# Patient Record
Sex: Female | Born: 1935 | Race: White | Hispanic: No | Smoking: Former smoker
Health system: Southern US, Community
[De-identification: ages and names within clinical notes are randomized; demographics above are authoritative.]

## PROBLEM LIST (undated history)

## (undated) DIAGNOSIS — M1990 Unspecified osteoarthritis, unspecified site: Secondary | ICD-10-CM

## (undated) DIAGNOSIS — R079 Chest pain, unspecified: Secondary | ICD-10-CM

## (undated) DIAGNOSIS — I779 Disorder of arteries and arterioles, unspecified: Secondary | ICD-10-CM

## (undated) DIAGNOSIS — D510 Vitamin B12 deficiency anemia due to intrinsic factor deficiency: Secondary | ICD-10-CM

## (undated) DIAGNOSIS — Z8489 Family history of other specified conditions: Secondary | ICD-10-CM

## (undated) DIAGNOSIS — I10 Essential (primary) hypertension: Secondary | ICD-10-CM

## (undated) DIAGNOSIS — L409 Psoriasis, unspecified: Secondary | ICD-10-CM

## (undated) DIAGNOSIS — Z8781 Personal history of (healed) traumatic fracture: Secondary | ICD-10-CM

## (undated) DIAGNOSIS — R229 Localized swelling, mass and lump, unspecified: Secondary | ICD-10-CM

## (undated) DIAGNOSIS — I739 Peripheral vascular disease, unspecified: Secondary | ICD-10-CM

## (undated) DIAGNOSIS — Z8673 Personal history of transient ischemic attack (TIA), and cerebral infarction without residual deficits: Secondary | ICD-10-CM

## (undated) DIAGNOSIS — E785 Hyperlipidemia, unspecified: Secondary | ICD-10-CM

## (undated) DIAGNOSIS — G5601 Carpal tunnel syndrome, right upper limb: Secondary | ICD-10-CM

## (undated) DIAGNOSIS — J302 Other seasonal allergic rhinitis: Secondary | ICD-10-CM

## (undated) DIAGNOSIS — Z972 Presence of dental prosthetic device (complete) (partial): Secondary | ICD-10-CM

## (undated) HISTORY — PX: APPENDECTOMY: SHX54

## (undated) HISTORY — DX: Hyperlipidemia, unspecified: E78.5

## (undated) HISTORY — PX: CHOLECYSTECTOMY: SHX55

## (undated) HISTORY — DX: Chest pain, unspecified: R07.9

## (undated) HISTORY — DX: Psoriasis, unspecified: L40.9

## (undated) HISTORY — DX: Essential (primary) hypertension: I10

## (undated) HISTORY — DX: Disorder of arteries and arterioles, unspecified: I77.9

## (undated) HISTORY — DX: Vitamin B12 deficiency anemia due to intrinsic factor deficiency: D51.0

## (undated) HISTORY — DX: Peripheral vascular disease, unspecified: I73.9

---

## 1967-03-06 HISTORY — PX: TUBAL LIGATION: SHX77

## 1972-03-05 HISTORY — PX: ABDOMINAL HYSTERECTOMY: SHX81

## 1998-03-05 HISTORY — PX: CAROTID ENDARTERECTOMY: SUR193

## 2000-07-30 ENCOUNTER — Encounter (INDEPENDENT_AMBULATORY_CARE_PROVIDER_SITE_OTHER): Payer: Self-pay | Admitting: Specialist

## 2000-07-30 ENCOUNTER — Ambulatory Visit (HOSPITAL_BASED_OUTPATIENT_CLINIC_OR_DEPARTMENT_OTHER): Admission: RE | Admit: 2000-07-30 | Discharge: 2000-07-30 | Payer: Self-pay | Admitting: Surgery

## 2000-07-30 HISTORY — PX: SUBMANDIBULAR GLAND EXCISION: SHX2456

## 2003-01-21 ENCOUNTER — Encounter: Admission: RE | Admit: 2003-01-21 | Discharge: 2003-01-21 | Payer: Self-pay | Admitting: Internal Medicine

## 2004-01-11 ENCOUNTER — Ambulatory Visit: Payer: Self-pay | Admitting: Internal Medicine

## 2004-03-20 ENCOUNTER — Ambulatory Visit: Payer: Self-pay | Admitting: Internal Medicine

## 2004-04-03 ENCOUNTER — Ambulatory Visit: Payer: Self-pay | Admitting: Internal Medicine

## 2004-04-28 ENCOUNTER — Ambulatory Visit: Payer: Self-pay | Admitting: Family Medicine

## 2004-05-10 ENCOUNTER — Ambulatory Visit: Payer: Self-pay | Admitting: Internal Medicine

## 2004-05-15 ENCOUNTER — Ambulatory Visit: Payer: Self-pay | Admitting: Internal Medicine

## 2004-05-19 ENCOUNTER — Ambulatory Visit: Payer: Self-pay | Admitting: Cardiology

## 2004-05-30 ENCOUNTER — Ambulatory Visit: Payer: Self-pay

## 2004-06-05 ENCOUNTER — Ambulatory Visit: Payer: Self-pay | Admitting: Cardiology

## 2004-07-26 ENCOUNTER — Ambulatory Visit: Payer: Self-pay | Admitting: Internal Medicine

## 2004-07-28 ENCOUNTER — Ambulatory Visit: Payer: Self-pay | Admitting: Internal Medicine

## 2004-11-10 ENCOUNTER — Ambulatory Visit: Payer: Self-pay | Admitting: Internal Medicine

## 2005-01-05 ENCOUNTER — Ambulatory Visit: Payer: Self-pay | Admitting: Internal Medicine

## 2005-03-15 ENCOUNTER — Ambulatory Visit: Payer: Self-pay | Admitting: Internal Medicine

## 2005-03-21 ENCOUNTER — Ambulatory Visit: Payer: Self-pay | Admitting: Internal Medicine

## 2005-04-09 ENCOUNTER — Ambulatory Visit: Payer: Self-pay | Admitting: Gastroenterology

## 2005-04-10 ENCOUNTER — Encounter (INDEPENDENT_AMBULATORY_CARE_PROVIDER_SITE_OTHER): Payer: Self-pay | Admitting: Specialist

## 2005-04-10 ENCOUNTER — Ambulatory Visit: Payer: Self-pay | Admitting: Gastroenterology

## 2005-04-23 ENCOUNTER — Ambulatory Visit: Payer: Self-pay | Admitting: Gastroenterology

## 2005-04-30 ENCOUNTER — Ambulatory Visit (HOSPITAL_COMMUNITY): Admission: RE | Admit: 2005-04-30 | Discharge: 2005-04-30 | Payer: Self-pay | Admitting: Gastroenterology

## 2005-09-10 ENCOUNTER — Ambulatory Visit: Payer: Self-pay | Admitting: Internal Medicine

## 2005-10-29 ENCOUNTER — Ambulatory Visit: Payer: Self-pay | Admitting: Internal Medicine

## 2006-02-05 ENCOUNTER — Ambulatory Visit: Payer: Self-pay | Admitting: Internal Medicine

## 2006-02-19 ENCOUNTER — Ambulatory Visit: Payer: Self-pay | Admitting: Internal Medicine

## 2006-02-19 LAB — CONVERTED CEMR LAB
ALT: 15 units/L (ref 0–40)
AST: 21 units/L (ref 0–37)
BUN: 25 mg/dL — ABNORMAL HIGH (ref 6–23)
CO2: 27 meq/L (ref 19–32)
Calcium: 9.9 mg/dL (ref 8.4–10.5)
Chloride: 100 meq/L (ref 96–112)
Creatinine, Ser: 1.4 mg/dL — ABNORMAL HIGH (ref 0.4–1.2)
GFR calc non Af Amer: 40 mL/min
Glomerular Filtration Rate, Af Am: 48 mL/min/{1.73_m2}
Glucose, Bld: 87 mg/dL (ref 70–99)
HCT: 41.6 % (ref 36.0–46.0)
Hemoglobin: 13.5 g/dL (ref 12.0–15.0)
MCHC: 32.5 g/dL (ref 30.0–36.0)
MCV: 90.3 fL (ref 78.0–100.0)
Platelets: 183 10*3/uL (ref 150–400)
Potassium: 4.2 meq/L (ref 3.5–5.1)
RBC: 4.61 M/uL (ref 3.87–5.11)
RDW: 13.6 % (ref 11.5–14.6)
Sodium: 138 meq/L (ref 135–145)
TSH: 2.56 microintl units/mL (ref 0.35–5.50)
WBC: 7 10*3/uL (ref 4.5–10.5)

## 2006-02-22 ENCOUNTER — Encounter: Payer: Self-pay | Admitting: Internal Medicine

## 2006-02-22 ENCOUNTER — Other Ambulatory Visit: Admission: RE | Admit: 2006-02-22 | Discharge: 2006-02-22 | Payer: Self-pay | Admitting: Internal Medicine

## 2006-02-22 ENCOUNTER — Ambulatory Visit: Payer: Self-pay | Admitting: Internal Medicine

## 2006-03-11 ENCOUNTER — Ambulatory Visit: Payer: Self-pay | Admitting: Internal Medicine

## 2006-05-30 ENCOUNTER — Ambulatory Visit: Payer: Self-pay

## 2006-06-06 ENCOUNTER — Ambulatory Visit: Payer: Self-pay | Admitting: Cardiology

## 2006-07-31 DIAGNOSIS — Z8679 Personal history of other diseases of the circulatory system: Secondary | ICD-10-CM | POA: Insufficient documentation

## 2006-07-31 DIAGNOSIS — D510 Vitamin B12 deficiency anemia due to intrinsic factor deficiency: Secondary | ICD-10-CM | POA: Insufficient documentation

## 2006-07-31 DIAGNOSIS — J309 Allergic rhinitis, unspecified: Secondary | ICD-10-CM | POA: Insufficient documentation

## 2006-07-31 DIAGNOSIS — E785 Hyperlipidemia, unspecified: Secondary | ICD-10-CM | POA: Insufficient documentation

## 2006-07-31 DIAGNOSIS — I10 Essential (primary) hypertension: Secondary | ICD-10-CM | POA: Insufficient documentation

## 2006-08-06 ENCOUNTER — Telehealth (INDEPENDENT_AMBULATORY_CARE_PROVIDER_SITE_OTHER): Payer: Self-pay | Admitting: *Deleted

## 2006-08-09 ENCOUNTER — Encounter: Payer: Self-pay | Admitting: Internal Medicine

## 2006-08-23 ENCOUNTER — Encounter (INDEPENDENT_AMBULATORY_CARE_PROVIDER_SITE_OTHER): Payer: Self-pay | Admitting: *Deleted

## 2006-09-16 ENCOUNTER — Ambulatory Visit: Payer: Self-pay | Admitting: Internal Medicine

## 2006-09-16 DIAGNOSIS — Z9889 Other specified postprocedural states: Secondary | ICD-10-CM | POA: Insufficient documentation

## 2006-09-16 DIAGNOSIS — Z8719 Personal history of other diseases of the digestive system: Secondary | ICD-10-CM | POA: Insufficient documentation

## 2006-09-16 DIAGNOSIS — N189 Chronic kidney disease, unspecified: Secondary | ICD-10-CM | POA: Insufficient documentation

## 2006-09-17 LAB — CONVERTED CEMR LAB
ALT: 18 units/L (ref 0–35)
AST: 25 units/L (ref 0–37)
BUN: 20 mg/dL (ref 6–23)
CO2: 31 meq/L (ref 19–32)
Calcium: 10 mg/dL (ref 8.4–10.5)
Chloride: 103 meq/L (ref 96–112)
Cholesterol: 162 mg/dL (ref 0–200)
Creatinine, Ser: 1.3 mg/dL — ABNORMAL HIGH (ref 0.4–1.2)
GFR calc Af Amer: 52 mL/min
GFR calc non Af Amer: 43 mL/min
Glucose, Bld: 84 mg/dL (ref 70–99)
HDL: 82.5 mg/dL (ref >39.0)
LDL Cholesterol: 64 mg/dL (ref 0–99)
Potassium: 3.9 meq/L (ref 3.5–5.1)
Sodium: 143 meq/L (ref 135–145)
Total CHOL/HDL Ratio: 2
Triglycerides: 78 mg/dL (ref 0–149)
VLDL: 16 mg/dL (ref 0–40)

## 2006-09-18 ENCOUNTER — Encounter (INDEPENDENT_AMBULATORY_CARE_PROVIDER_SITE_OTHER): Payer: Self-pay | Admitting: *Deleted

## 2006-09-20 ENCOUNTER — Ambulatory Visit: Payer: Self-pay | Admitting: Family Medicine

## 2006-11-21 ENCOUNTER — Encounter: Payer: Self-pay | Admitting: Internal Medicine

## 2007-01-06 ENCOUNTER — Telehealth (INDEPENDENT_AMBULATORY_CARE_PROVIDER_SITE_OTHER): Payer: Self-pay | Admitting: *Deleted

## 2007-01-08 ENCOUNTER — Emergency Department (HOSPITAL_COMMUNITY): Admission: EM | Admit: 2007-01-08 | Discharge: 2007-01-08 | Payer: Self-pay | Admitting: Emergency Medicine

## 2007-01-08 ENCOUNTER — Telehealth: Payer: Self-pay | Admitting: Internal Medicine

## 2007-01-10 ENCOUNTER — Ambulatory Visit: Payer: Self-pay | Admitting: Cardiology

## 2007-01-10 ENCOUNTER — Ambulatory Visit: Payer: Self-pay | Admitting: Internal Medicine

## 2007-01-10 DIAGNOSIS — R519 Headache, unspecified: Secondary | ICD-10-CM | POA: Insufficient documentation

## 2007-01-10 DIAGNOSIS — R51 Headache: Secondary | ICD-10-CM | POA: Insufficient documentation

## 2007-01-28 ENCOUNTER — Telehealth (INDEPENDENT_AMBULATORY_CARE_PROVIDER_SITE_OTHER): Payer: Self-pay | Admitting: *Deleted

## 2007-01-29 ENCOUNTER — Encounter: Payer: Self-pay | Admitting: Internal Medicine

## 2007-03-19 ENCOUNTER — Ambulatory Visit: Payer: Self-pay | Admitting: Internal Medicine

## 2007-03-24 ENCOUNTER — Telehealth (INDEPENDENT_AMBULATORY_CARE_PROVIDER_SITE_OTHER): Payer: Self-pay | Admitting: *Deleted

## 2007-03-24 LAB — CONVERTED CEMR LAB
BUN: 19 mg/dL (ref 6–23)
Basophils Absolute: 0 10*3/uL (ref 0.0–0.1)
Basophils Relative: 0.1 % (ref 0.0–1.0)
CO2: 29 meq/L (ref 19–32)
Calcium: 10 mg/dL (ref 8.4–10.5)
Chloride: 103 meq/L (ref 96–112)
Creatinine, Ser: 1.2 mg/dL (ref 0.4–1.2)
Eosinophils Absolute: 0.3 10*3/uL (ref 0.0–0.6)
Eosinophils Relative: 5.3 % — ABNORMAL HIGH (ref 0.0–5.0)
Folate: 15.6 ng/mL
GFR calc Af Amer: 57 mL/min
GFR calc non Af Amer: 47 mL/min
Glucose, Bld: 81 mg/dL (ref 70–99)
HCT: 39.9 % (ref 36.0–46.0)
Hemoglobin: 13.4 g/dL (ref 12.0–15.0)
Iron: 83 ug/dL (ref 42–145)
Lymphocytes Relative: 40.6 % (ref 12.0–46.0)
MCHC: 33.6 g/dL (ref 30.0–36.0)
MCV: 87.8 fL (ref 78.0–100.0)
Monocytes Absolute: 0.5 10*3/uL (ref 0.2–0.7)
Monocytes Relative: 9 % (ref 3.0–11.0)
Neutro Abs: 2.5 10*3/uL (ref 1.4–7.7)
Neutrophils Relative %: 45 % (ref 43.0–77.0)
Platelets: 161 10*3/uL (ref 150–400)
Potassium: 4 meq/L (ref 3.5–5.1)
RBC: 4.55 M/uL (ref 3.87–5.11)
RDW: 14.2 % (ref 11.5–14.6)
Sodium: 141 meq/L (ref 135–145)
Vitamin B-12: 381 pg/mL (ref 211–911)
WBC: 5.5 10*3/uL (ref 4.5–10.5)

## 2007-04-03 ENCOUNTER — Ambulatory Visit: Payer: Self-pay | Admitting: Internal Medicine

## 2007-04-07 ENCOUNTER — Encounter: Payer: Self-pay | Admitting: Internal Medicine

## 2007-04-14 ENCOUNTER — Ambulatory Visit: Payer: Self-pay | Admitting: Internal Medicine

## 2007-05-01 ENCOUNTER — Encounter: Payer: Self-pay | Admitting: Internal Medicine

## 2007-05-13 ENCOUNTER — Telehealth (INDEPENDENT_AMBULATORY_CARE_PROVIDER_SITE_OTHER): Payer: Self-pay | Admitting: *Deleted

## 2007-05-13 ENCOUNTER — Encounter: Payer: Self-pay | Admitting: Internal Medicine

## 2007-09-03 ENCOUNTER — Telehealth (INDEPENDENT_AMBULATORY_CARE_PROVIDER_SITE_OTHER): Payer: Self-pay | Admitting: *Deleted

## 2007-09-04 ENCOUNTER — Ambulatory Visit: Payer: Self-pay | Admitting: Internal Medicine

## 2007-09-08 ENCOUNTER — Telehealth (INDEPENDENT_AMBULATORY_CARE_PROVIDER_SITE_OTHER): Payer: Self-pay | Admitting: *Deleted

## 2007-09-22 ENCOUNTER — Ambulatory Visit: Payer: Self-pay | Admitting: Internal Medicine

## 2007-09-30 ENCOUNTER — Telehealth (INDEPENDENT_AMBULATORY_CARE_PROVIDER_SITE_OTHER): Payer: Self-pay | Admitting: *Deleted

## 2007-09-30 LAB — CONVERTED CEMR LAB
ALT: 16 units/L (ref 0–35)
AST: 20 units/L (ref 0–37)
BUN: 20 mg/dL (ref 6–23)
CO2: 29 meq/L (ref 19–32)
Calcium: 9.5 mg/dL (ref 8.4–10.5)
Chloride: 106 meq/L (ref 96–112)
Cholesterol: 245 mg/dL (ref 0–200)
Creatinine, Ser: 1.1 mg/dL (ref 0.4–1.2)
Direct LDL: 162.1 mg/dL
GFR calc Af Amer: 63 mL/min
GFR calc non Af Amer: 52 mL/min
Glucose, Bld: 81 mg/dL (ref 70–99)
HDL: 72.8 mg/dL (ref >39.0)
Potassium: 4.7 meq/L (ref 3.5–5.1)
Sodium: 142 meq/L (ref 135–145)
Total CHOL/HDL Ratio: 3.4
Triglycerides: 84 mg/dL (ref 0–149)
VLDL: 17 mg/dL (ref 0–40)

## 2007-10-03 ENCOUNTER — Telehealth (INDEPENDENT_AMBULATORY_CARE_PROVIDER_SITE_OTHER): Payer: Self-pay | Admitting: *Deleted

## 2007-12-30 ENCOUNTER — Encounter (INDEPENDENT_AMBULATORY_CARE_PROVIDER_SITE_OTHER): Payer: Self-pay | Admitting: *Deleted

## 2007-12-30 ENCOUNTER — Telehealth: Payer: Self-pay | Admitting: Internal Medicine

## 2008-01-01 ENCOUNTER — Ambulatory Visit: Payer: Self-pay | Admitting: Internal Medicine

## 2008-01-01 LAB — CONVERTED CEMR LAB
ALT: 16 units/L (ref 0–35)
AST: 19 units/L (ref 0–37)
Cholesterol: 149 mg/dL (ref 0–200)
HDL: 68.9 mg/dL (ref >39.0)
LDL Cholesterol: 64 mg/dL (ref 0–99)
Total CHOL/HDL Ratio: 2.2
Triglycerides: 80 mg/dL (ref 0–149)
VLDL: 16 mg/dL (ref 0–40)

## 2008-01-05 ENCOUNTER — Telehealth (INDEPENDENT_AMBULATORY_CARE_PROVIDER_SITE_OTHER): Payer: Self-pay | Admitting: *Deleted

## 2008-01-09 ENCOUNTER — Telehealth (INDEPENDENT_AMBULATORY_CARE_PROVIDER_SITE_OTHER): Payer: Self-pay | Admitting: *Deleted

## 2008-01-16 ENCOUNTER — Ambulatory Visit: Payer: Self-pay | Admitting: Family Medicine

## 2008-01-20 ENCOUNTER — Encounter (INDEPENDENT_AMBULATORY_CARE_PROVIDER_SITE_OTHER): Payer: Self-pay | Admitting: *Deleted

## 2008-03-11 ENCOUNTER — Ambulatory Visit: Payer: Self-pay | Admitting: Internal Medicine

## 2008-03-11 LAB — CONVERTED CEMR LAB
Basophils Absolute: 0.1 10*3/uL (ref 0.0–0.1)
Basophils Relative: 0.9 % (ref 0.0–3.0)
Eosinophils Absolute: 0.2 10*3/uL (ref 0.0–0.7)
Eosinophils Relative: 2.6 % (ref 0.0–5.0)
HCT: 37.8 % (ref 36.0–46.0)
Hemoglobin: 12.5 g/dL (ref 12.0–15.0)
Lymphocytes Relative: 31.8 % (ref 12.0–46.0)
MCHC: 33.2 g/dL (ref 30.0–36.0)
MCV: 89.5 fL (ref 78.0–100.0)
Monocytes Absolute: 0.5 10*3/uL (ref 0.1–1.0)
Monocytes Relative: 8 % (ref 3.0–12.0)
Neutro Abs: 3.8 10*3/uL (ref 1.4–7.7)
Neutrophils Relative %: 56.7 % (ref 43.0–77.0)
Platelets: 161 10*3/uL (ref 150–400)
RBC: 4.22 M/uL (ref 3.87–5.11)
RDW: 14.4 % (ref 11.5–14.6)
WBC: 6.8 10*3/uL (ref 4.5–10.5)

## 2008-03-12 ENCOUNTER — Encounter: Payer: Self-pay | Admitting: Internal Medicine

## 2008-03-12 LAB — CONVERTED CEMR LAB: IgE (Immunoglobulin E), Serum: 3.6 intl units/mL (ref 0.0–180.0)

## 2008-03-21 DIAGNOSIS — J329 Chronic sinusitis, unspecified: Secondary | ICD-10-CM | POA: Insufficient documentation

## 2008-04-20 ENCOUNTER — Ambulatory Visit: Payer: Self-pay | Admitting: Internal Medicine

## 2008-04-22 ENCOUNTER — Encounter: Admission: RE | Admit: 2008-04-22 | Discharge: 2008-04-22 | Payer: Self-pay | Admitting: Otolaryngology

## 2008-05-15 ENCOUNTER — Emergency Department (HOSPITAL_BASED_OUTPATIENT_CLINIC_OR_DEPARTMENT_OTHER): Admission: EM | Admit: 2008-05-15 | Discharge: 2008-05-16 | Payer: Self-pay | Admitting: Emergency Medicine

## 2008-06-17 ENCOUNTER — Ambulatory Visit: Payer: Self-pay | Admitting: Internal Medicine

## 2008-06-17 LAB — CONVERTED CEMR LAB: Vit D, 25-Hydroxy: 53 ng/mL (ref 30–89)

## 2008-06-23 LAB — CONVERTED CEMR LAB
Folate: 13.3 ng/mL
TSH: 3.36 microintl units/mL (ref 0.35–5.50)
Vitamin B-12: 565 pg/mL (ref 211–911)

## 2008-06-25 ENCOUNTER — Encounter: Payer: Self-pay | Admitting: Internal Medicine

## 2008-07-06 ENCOUNTER — Encounter (INDEPENDENT_AMBULATORY_CARE_PROVIDER_SITE_OTHER): Payer: Self-pay | Admitting: *Deleted

## 2008-07-12 ENCOUNTER — Telehealth: Payer: Self-pay | Admitting: Internal Medicine

## 2008-07-16 ENCOUNTER — Telehealth (INDEPENDENT_AMBULATORY_CARE_PROVIDER_SITE_OTHER): Payer: Self-pay | Admitting: *Deleted

## 2008-08-26 DIAGNOSIS — I6529 Occlusion and stenosis of unspecified carotid artery: Secondary | ICD-10-CM | POA: Insufficient documentation

## 2008-09-03 ENCOUNTER — Ambulatory Visit: Payer: Self-pay | Admitting: Cardiology

## 2008-09-03 DIAGNOSIS — Z87448 Personal history of other diseases of urinary system: Secondary | ICD-10-CM | POA: Insufficient documentation

## 2008-09-09 ENCOUNTER — Ambulatory Visit: Payer: Self-pay

## 2008-09-09 ENCOUNTER — Encounter: Payer: Self-pay | Admitting: Cardiology

## 2008-09-15 ENCOUNTER — Telehealth: Payer: Self-pay | Admitting: Cardiology

## 2008-09-16 ENCOUNTER — Telehealth: Payer: Self-pay | Admitting: Cardiology

## 2008-10-15 ENCOUNTER — Encounter (INDEPENDENT_AMBULATORY_CARE_PROVIDER_SITE_OTHER): Payer: Self-pay | Admitting: *Deleted

## 2008-10-28 ENCOUNTER — Ambulatory Visit: Payer: Self-pay | Admitting: Internal Medicine

## 2008-11-11 ENCOUNTER — Encounter: Payer: Self-pay | Admitting: Internal Medicine

## 2008-11-18 ENCOUNTER — Ambulatory Visit: Payer: Self-pay | Admitting: Internal Medicine

## 2008-11-18 DIAGNOSIS — F411 Generalized anxiety disorder: Secondary | ICD-10-CM | POA: Insufficient documentation

## 2008-11-23 ENCOUNTER — Encounter (INDEPENDENT_AMBULATORY_CARE_PROVIDER_SITE_OTHER): Payer: Self-pay | Admitting: *Deleted

## 2008-11-23 LAB — CONVERTED CEMR LAB
BUN: 22 mg/dL (ref 6–23)
CO2: 30 meq/L (ref 19–32)
Calcium: 8.9 mg/dL (ref 8.4–10.5)
Chloride: 108 meq/L (ref 96–112)
Cholesterol: 150 mg/dL (ref 0–200)
Creatinine, Ser: 1.3 mg/dL — ABNORMAL HIGH (ref 0.4–1.2)
GFR calc non Af Amer: 42.77 mL/min (ref >60)
Glucose, Bld: 93 mg/dL (ref 70–99)
HDL: 66.8 mg/dL (ref >39.00)
LDL Cholesterol: 67 mg/dL (ref 0–99)
Potassium: 4.9 meq/L (ref 3.5–5.1)
Sodium: 144 meq/L (ref 135–145)
Total CHOL/HDL Ratio: 2
Triglycerides: 79 mg/dL (ref 0.0–149.0)
VLDL: 15.8 mg/dL (ref 0.0–40.0)

## 2008-12-06 ENCOUNTER — Encounter: Payer: Self-pay | Admitting: Internal Medicine

## 2009-02-08 ENCOUNTER — Encounter: Payer: Self-pay | Admitting: Cardiology

## 2009-02-16 ENCOUNTER — Encounter: Payer: Self-pay | Admitting: Internal Medicine

## 2009-02-16 HISTORY — PX: ANTERIOR CERVICAL DECOMP/DISCECTOMY FUSION: SHX1161

## 2009-03-05 HISTORY — PX: SHOULDER ARTHROSCOPY WITH SUBACROMIAL DECOMPRESSION, ROTATOR CUFF REPAIR AND BICEP TENDON REPAIR: SHX5687

## 2009-04-01 ENCOUNTER — Encounter: Payer: Self-pay | Admitting: Internal Medicine

## 2009-04-04 ENCOUNTER — Telehealth: Payer: Self-pay | Admitting: Internal Medicine

## 2009-05-26 ENCOUNTER — Ambulatory Visit: Payer: Self-pay | Admitting: Internal Medicine

## 2009-05-26 DIAGNOSIS — M1990 Unspecified osteoarthritis, unspecified site: Secondary | ICD-10-CM | POA: Insufficient documentation

## 2009-05-26 DIAGNOSIS — M8580 Other specified disorders of bone density and structure, unspecified site: Secondary | ICD-10-CM | POA: Insufficient documentation

## 2009-05-27 ENCOUNTER — Ambulatory Visit: Payer: Self-pay | Admitting: Internal Medicine

## 2009-06-01 LAB — CONVERTED CEMR LAB
ALT: 19 units/L (ref 0–35)
AST: 29 units/L (ref 0–37)
BUN: 18 mg/dL (ref 6–23)
Basophils Absolute: 0.1 10*3/uL (ref 0.0–0.1)
Basophils Relative: 1 % (ref 0.0–3.0)
CO2: 27 meq/L (ref 19–32)
Calcium: 9.2 mg/dL (ref 8.4–10.5)
Chloride: 104 meq/L (ref 96–112)
Creatinine, Ser: 1.2 mg/dL (ref 0.4–1.2)
Eosinophils Absolute: 0.2 10*3/uL (ref 0.0–0.7)
Eosinophils Relative: 3.5 % (ref 0.0–5.0)
GFR calc non Af Amer: 46.84 mL/min (ref >60)
Glucose, Bld: 81 mg/dL (ref 70–99)
HCT: 40.8 % (ref 36.0–46.0)
Hemoglobin: 13.2 g/dL (ref 12.0–15.0)
Lymphocytes Relative: 35.2 % (ref 12.0–46.0)
Lymphs Abs: 1.9 10*3/uL (ref 0.7–4.0)
MCHC: 32.2 g/dL (ref 30.0–36.0)
MCV: 90.4 fL (ref 78.0–100.0)
Monocytes Absolute: 0.5 10*3/uL (ref 0.1–1.0)
Monocytes Relative: 9.8 % (ref 3.0–12.0)
Neutro Abs: 2.6 10*3/uL (ref 1.4–7.7)
Neutrophils Relative %: 50.5 % (ref 43.0–77.0)
Platelets: 142 10*3/uL — ABNORMAL LOW (ref 150.0–400.0)
Potassium: 3.9 meq/L (ref 3.5–5.1)
RBC: 4.51 M/uL (ref 3.87–5.11)
RDW: 13.8 % (ref 11.5–14.6)
Sodium: 142 meq/L (ref 135–145)
WBC: 5.3 10*3/uL (ref 4.5–10.5)

## 2009-09-16 ENCOUNTER — Telehealth: Payer: Self-pay | Admitting: Internal Medicine

## 2009-09-28 ENCOUNTER — Encounter: Payer: Self-pay | Admitting: Cardiology

## 2009-09-29 ENCOUNTER — Ambulatory Visit: Payer: Self-pay

## 2009-09-29 ENCOUNTER — Ambulatory Visit: Payer: Self-pay | Admitting: Cardiology

## 2009-10-09 ENCOUNTER — Encounter: Payer: Self-pay | Admitting: Internal Medicine

## 2009-10-10 ENCOUNTER — Encounter: Payer: Self-pay | Admitting: Internal Medicine

## 2009-10-17 ENCOUNTER — Ambulatory Visit: Payer: Self-pay | Admitting: Internal Medicine

## 2009-10-17 DIAGNOSIS — L409 Psoriasis, unspecified: Secondary | ICD-10-CM | POA: Insufficient documentation

## 2009-10-18 ENCOUNTER — Encounter: Payer: Self-pay | Admitting: Internal Medicine

## 2009-10-19 ENCOUNTER — Ambulatory Visit: Payer: Self-pay | Admitting: Internal Medicine

## 2009-10-21 LAB — CONVERTED CEMR LAB
BUN: 20 mg/dL (ref 6–23)
CO2: 30 meq/L (ref 19–32)
Calcium: 9.9 mg/dL (ref 8.4–10.5)
Chloride: 105 meq/L (ref 96–112)
Creatinine, Ser: 1.1 mg/dL (ref 0.4–1.2)
GFR calc non Af Amer: 50.14 mL/min (ref >60)
Glucose, Bld: 75 mg/dL (ref 70–99)
Potassium: 4.8 meq/L (ref 3.5–5.1)
Sodium: 143 meq/L (ref 135–145)

## 2009-11-03 ENCOUNTER — Encounter: Payer: Self-pay | Admitting: Internal Medicine

## 2009-11-03 ENCOUNTER — Telehealth: Payer: Self-pay | Admitting: Internal Medicine

## 2009-11-15 ENCOUNTER — Encounter: Payer: Self-pay | Admitting: Cardiovascular Disease

## 2009-11-15 ENCOUNTER — Telehealth: Payer: Self-pay | Admitting: Cardiology

## 2010-01-03 ENCOUNTER — Encounter: Payer: Self-pay | Admitting: Internal Medicine

## 2010-02-20 ENCOUNTER — Ambulatory Visit: Payer: Self-pay | Admitting: Internal Medicine

## 2010-02-20 ENCOUNTER — Other Ambulatory Visit
Admission: RE | Admit: 2010-02-20 | Discharge: 2010-02-20 | Payer: Self-pay | Source: Home / Self Care | Admitting: Internal Medicine

## 2010-02-20 ENCOUNTER — Encounter: Payer: Self-pay | Admitting: Internal Medicine

## 2010-02-20 LAB — CONVERTED CEMR LAB: Vit D, 25-Hydroxy: 64 ng/mL (ref 30–89)

## 2010-02-20 LAB — HM PAP SMEAR: HM Pap smear: NORMAL

## 2010-02-22 LAB — CONVERTED CEMR LAB
ALT: 17 units/L (ref 0–35)
AST: 25 units/L (ref 0–37)
BUN: 20 mg/dL (ref 6–23)
CO2: 30 meq/L (ref 19–32)
Calcium: 9.5 mg/dL (ref 8.4–10.5)
Chloride: 105 meq/L (ref 96–112)
Cholesterol: 162 mg/dL (ref 0–200)
Creatinine, Ser: 1.3 mg/dL — ABNORMAL HIGH (ref 0.4–1.2)
GFR calc non Af Amer: 41.15 mL/min — ABNORMAL LOW (ref >60.00)
Glucose, Bld: 91 mg/dL (ref 70–99)
HDL: 69.9 mg/dL (ref >39.00)
LDL Cholesterol: 77 mg/dL (ref 0–99)
Potassium: 4.9 meq/L (ref 3.5–5.1)
Sodium: 143 meq/L (ref 135–145)
Total CHOL/HDL Ratio: 2
Triglycerides: 77 mg/dL (ref 0.0–149.0)
VLDL: 15.4 mg/dL (ref 0.0–40.0)

## 2010-03-07 LAB — CONVERTED CEMR LAB: Pap Smear: NEGATIVE

## 2010-04-04 NOTE — Progress Notes (Signed)
Summary: Refill Request  Phone Note Refill Request Call back at [phone] Message from:  Pharmacy on September 16, 2009 2:40 PM  Refills Requested: Medication #1:  KLONOPIN 0.5 MG TABS 1 by mouth two times a day; 2 by mouth at bedtime as needed anxiety-insomnia.   Dosage confirmed as above?Dosage Confirmed   Supply Requested: 1 month [facility] [address]  Next Appointment Scheduled: August 15th 2011 Initial call taken by: Lavell Islam,  September 16, 2009 2:43 PM  Follow-up for Phone Call        ok 90 and 1 RF Ivar Domangue E. Lemon Whitacre MD  September 16, 2009 3:56 PM     Prescriptions: KLONOPIN 0.5 MG TABS (CLONAZEPAM) 1 by mouth two times a day; 2 by mouth at bedtime as needed anxiety-insomnia  #90 x 1   Entered by:   Army Fossa CMA   Authorized by:   Nolon Rod. Cashius Grandstaff MD   Signed by:   Army Fossa CMA on 09/16/2009   Method used:   Printed then faxed to ...       [facility] [phone]* (retail)       [address]       [place], [place]  [address]       Ph: [phone]       Fax: [phone]   RxID:   [id number]

## 2010-04-04 NOTE — Assessment & Plan Note (Signed)
Summary: 4 MONTH OV//PH   Vital Signs:  Patient profile:   75 year old female Height:      64.25 inches Weight:      153.4 pounds BMI:     26.22 Pulse rate:   64 / minute BP sitting:   132 / 70  Vitals Entered By: R.Peeler CC: f/u Comments fasting pain in right arm   History of Present Illness: 12-10 --s/p extensive cervical spine surgery, decompression, allograft placement-- note reviewed  neck pain better, still pain in the shoulders, saw Dr Ethelene Hal recently for a shot: did help  going back to work next week     Hypertension-- good medication compliance, ambulatory BPs 130s   h/o pernicious anemia-- on B12 shots, good medication compliance   osteopenia -- good medication compliance w/ fosamax, no problems taking it     Allergies: 1)  ! Morphine Sulfate (Morphine Sulfate) 2)  ! Flexeril (Cyclobenzaprine Hcl) 3)  ! * Clarithromycin 4)  ! Amoxicillin 5)  ! Tramadol Hcl 6)  ! Codeine 7)  ! Hydrocodone  Past History:  Past Medical History: Coronary artery disease, stress test 2004 "fix defect"    Cerebrovascular accident 2000,   CAROTID ENDARTERECTOMY, RIGHT,  -- due to trauma    Hyperlipidemia    Hypertension    Allergic rhinitis- Skin test 04/20/08    Recurrent rhinosinusitis    Several pneumonias and otitis  as a child.    Salivary gland infection, s/p removal Dr Ezzard Standing    Hemorrhoid    h/o pernicious anemia Hemorrhoid psoriasis (2010) osteopenia,  DEXA 4-10 , started Fosamax    Past Surgical History: Cholecystectomy tubal ligation Hysterectomy, no oophorectomy Salivary gland & LN resection 2002 ,Dr Ezzard Standing CAROTID ENDARTERECTOMY, RIGHT 2,000 -- due to trauma Appendectomy 12-10 extensive cervical spine surgery, decompression, allograft placement  Social History: Reviewed history from 04/20/2008 and no changes required. Former Smoker-1994 Alcohol use-no Single/ divorced/ lives alone 3 children part-time Statistician- Holiday representative- stands in  doorway.  Review of Systems       feels well except for some fatigue since the surgery CV:  Denies chest pain or discomfort and swelling of feet; no orthopnea. Resp:  Denies cough and wheezing. GI:  Denies diarrhea, nausea, and vomiting.  Physical Exam  General:  alert, well-developed, and well-nourished.   Neck:  range of motion apparently without deficits Lungs:  normal respiratory effort, no intercostal retractions, no accessory muscle use, and normal breath sounds.   Heart:  normal rate, regular rhythm, and no murmur.   Extremities:  no edema Psych:  Oriented X3, memory intact for recent and remote, normally interactive, good eye contact, not anxious appearing, and not depressed appearing.  good spirits   Impression & Recommendations:  Problem # 1:  slight fatigue, likely deconditioning, labs  Problem # 2:  HYPERTENSION (ICD-401.9) well-controlled, reports good ambulatory BPs, labs Her updated medication list for this problem includes:    Hydrochlorothiazide 25 Mg Tabs (Hydrochlorothiazide) .Marland Kitchen... 1 by mouth once daily    Diovan 160 Mg Tabs (Valsartan) .Marland Kitchen... 1 by mouth qd    Felodipine 5 Mg Tb24 (Felodipine) .Marland Kitchen... 1 by mouth once daily  BP today: 132/70 Prior BP: 124/80 (11/18/2008)  Labs Reviewed: K+: 4.9 (11/18/2008) Creat: : 1.3 (11/18/2008)   Chol: 150 (11/18/2008)   HDL: 66.80 (11/18/2008)   LDL: 67 (11/18/2008)   TG: 79.0 (11/18/2008)  Orders: Venipuncture (30865) TLB-BMP (Basic Metabolic Panel-BMET) (80048-METABOL) TLB-CBC Platelet - w/Differential (85025-CBCD)  Problem # 3:  HYPERLIPIDEMIA (  ICD-272.4) on medication, labs Her updated medication list for this problem includes:    Crestor 40 Mg Tabs (Rosuvastatin calcium) .Marland Kitchen... 1 by mouth qhs  Orders: TLB-ALT (SGPT) (84460-ALT) TLB-AST (SGOT) (84450-SGOT)  Labs Reviewed: SGOT: 19 (01/01/2008)   SGPT: 16 (01/01/2008)   HDL:66.80 (11/18/2008), 68.9 (01/01/2008)  LDL:67 (11/18/2008), 64 (01/01/2008)  Chol:150  (11/18/2008), 149 (01/01/2008)  Trig:79.0 (11/18/2008), 80 (01/01/2008)  Problem # 4:  OSTEOPENIA (ICD-733.90) bone density test 4-10 showed  osteopenia vitamin D at that time was normal tolerating Fosamax well Her updated medication list for this problem includes:    Alendronate Sodium 70 Mg Tabs (Alendronate sodium) .Marland Kitchen... 1 by mouth qwk  Problem # 5:  DEGENERATIVE JOINT DISEASE (ICD-715.90) extensive neck surgery 02/2009, doing well, still on physical therapy Her updated medication list for this problem includes:    Aspir-low 81 Mg Tbec (Aspirin) .Marland Kitchen... Daily  Complete Medication List: 1)  Hydrochlorothiazide 25 Mg Tabs (Hydrochlorothiazide) .Marland Kitchen.. 1 by mouth once daily 2)  Crestor 40 Mg Tabs (Rosuvastatin calcium) .Marland Kitchen.. 1 by mouth qhs 3)  Diovan 160 Mg Tabs (Valsartan) .Marland Kitchen.. 1 by mouth qd 4)  Flonase 50 Mcg/act Susp (Fluticasone propionate) .... 2 sprays each nostril once daily 5)  Felodipine 5 Mg Tb24 (Felodipine) .Marland Kitchen.. 1 by mouth once daily 6)  Cyanocobalamin 1000 Mcg/ml Inj Soln (Cyanocobalamin) .Marland Kitchen.. 1000 micrograms qmo 7)  Alendronate Sodium 70 Mg Tabs (Alendronate sodium) .Marland Kitchen.. 1 by mouth qwk 8)  Aspir-low 81 Mg Tbec (Aspirin) .... Daily 9)  Klonopin 0.5 Mg Tabs (Clonazepam) .Marland Kitchen.. 1 by mouth two times a day; 2 by mouth at bedtime as needed anxiety-insomnia  Patient Instructions: 1)  Please schedule a follow-up appointment in 4 months .

## 2010-04-04 NOTE — Letter (Signed)
Summary: [facility]-- sinusitis  [facility]   Imported By: Lanelle Bal 10/18/2009 10:27:51  _____________________________________________________________________  External Attachment:    Type:   Image     Comment:   External Document

## 2010-04-04 NOTE — Medication Information (Signed)
Summary: Med Orders  Med Orders   Imported By: Marylou Mccoy 11/30/2009 10:52:29  _____________________________________________________________________  External Attachment:    Type:   Image     Comment:   External Document

## 2010-04-04 NOTE — Progress Notes (Signed)
Summary: referral to PT  Phone Note Call from Patient Call back at Home Phone [phone]   Summary of Call: recently had surgery on her neck @ [facility], requesting referral to PT in [place] so she doesn't have to travel to [facility]  April 04, 2009 4:55 PM   Follow-up for Phone Call        yes, needs to instructions from her surgeon regards what she needs as far as PT [name] Moya E. Sahmya Arai MD  April 04, 2009 5:04 PM   Patient dropped off order from surgeon @ [facility] -- will forward to [facility] for referral.  Patient has rov scheduled for 3/11 [facility]  April 05, 2009 1:17 PM     Prescriptions: CRESTOR 40 MG  TABS (ROSUVASTATIN CALCIUM) 1 by mouth qhs  #30 Tablet x 3   Entered by:   Shary Decamp   Authorized by:   Nolon Rod. Suriya Kovarik MD   Signed by:   Shary Decamp on 04/05/2009   Method used:   Electronically to        [facility] [phone]* (retail)       [address]       [place], [place]  [address]       Ph: [phone]       Fax: [phone]   RxID:   [id number]

## 2010-04-04 NOTE — Consult Note (Signed)
Summary: shoulder  pain, refered to ortho, MRI---[facility]   Imported By: Lanelle Bal 10/27/2009 11:29:03  _____________________________________________________________________  External Attachment:    Type:   Image     Comment:   External Document

## 2010-04-04 NOTE — Progress Notes (Signed)
Summary: Need order to stop Aspirin due to surgery   Phone Note Other Incoming   Caller: [facility] [phone] Summary of Call: Need order faxed for pt to stop Aspirin due to surgery fax [phone] Initial call taken by: Judie Grieve,  November 15, 2009 10:13 AM  Follow-up for Phone Call        I spoke with [facility]. The pt is scheduled for right rotator cuff repair with open DCR. Dr. Ranell Patrick is her surgeon. Surgery date is 9/19. They request she hold ASA for 5 days prior to surgery. Per Zollie Scale, the pt has told her that for prior surgeries she has held ASA for only 3 days. Dr. Drue Novel has given the ok to hold ASA, but with the pt's history, they want cardiology to give the ok to hold ASA. I will review with the DOD. Sherri Rad, RN, BSN  November 15, 2009 10:32 AM   I reviewed the above with Dr. Clifton James- orders received that pt may hold ASA 5 days prior to surgery. I will fax a note. Follow-up by: Sherri Rad, RN, BSN,  November 15, 2009 10:54 AM

## 2010-04-04 NOTE — Op Note (Signed)
Summary: Cervical Spine surgery----[facility]  Cervical Spine/[facility]   Imported By: Lanelle Bal 04/12/2009 09:28:22  _____________________________________________________________________  External Attachment:    Type:   Image     Comment:   External Document

## 2010-04-04 NOTE — Assessment & Plan Note (Signed)
Summary: rto 4 months.cbs   Vital Signs:  Patient profile:   76 year old female Weight:      158.25 pounds Pulse rate:   87 / minute Pulse rhythm:   regular BP sitting:   128 / 82  (left arm) Cuff size:   large  Vitals Entered By: Army Fossa CMA (October 17, 2009 2:02 PM) CC: 4 month f/u: fasting  Comments Discuss Fosamax would like to know if you would refill her Clobetasol Propionate 0.05% (She does not want to see Dr.Lupton anymore)    History of Present Illness: routine office visit Feels well Note from cardiology reviewed, she is stable  ROS Continue with shoulder pain, to see Dr.Ramos tomorrow Has a rash on and off, she saw dermatology before, was prescribed clobetasol as needed. Would like [place] to refill her medicines reports a cyst in the right wrist Denies chest pain or shortness of breath No cough or  lower extremity edema  Current Medications (verified): 1)  Hydrochlorothiazide 25 Mg Tabs (Hydrochlorothiazide) .Marland Kitchen.. 1 By Mouth Once Daily 2)  Crestor 40 Mg  Tabs (Rosuvastatin Calcium) .Marland Kitchen.. 1 By Mouth Qhs 3)  Diovan 160 Mg Tabs (Valsartan) .Marland Kitchen.. 1 By Mouth Once Daily. Needs Office Visit. 4)  Flonase 50 Mcg/act  Susp (Fluticasone Propionate) .... 2 Sprays Each Nostril Once Daily 5)  Felodipine 5 Mg  Tb24 (Felodipine) .Marland Kitchen.. 1 By Mouth Once Daily. Needs Office Visit. 6)  Cyanocobalamin 1000 Mcg/ml Inj Soln (Cyanocobalamin) .Marland Kitchen.. 1000 Micrograms Qmo 7)  Alendronate Sodium 70 Mg Tabs (Alendronate Sodium) .Marland Kitchen.. 1 By Mouth Qwk 8)  Aspir-Low 81 Mg Tbec (Aspirin) .... Daily 9)  Klonopin 0.5 Mg Tabs (Clonazepam) .Marland Kitchen.. 1 By Mouth Two Times A Day; 2 By Mouth At Bedtime As Needed Anxiety-Insomnia 10)  Clobetasol Propionate 0.05 % Crea (Clobetasol Propionate) .... Apply A Small Amount To Affected Areas Two Times A Day  Allergies: 1)  ! Morphine Sulfate (Morphine Sulfate) 2)  ! Flexeril (Cyclobenzaprine Hcl) 3)  ! * Clarithromycin 4)  ! Amoxicillin 5)  ! Tramadol Hcl 6)  !  Codeine 7)  ! Hydrocodone  Past History:  Past Medical History: Reviewed history from 05/26/2009 and no changes required. Coronary artery disease, stress test 2004 "fix defect"    Cerebrovascular accident 2000,   CAROTID ENDARTERECTOMY, RIGHT,  -- due to trauma    Hyperlipidemia    Hypertension    Allergic rhinitis- Skin test 04/20/08    Recurrent rhinosinusitis    Several pneumonias and otitis  as a child.    Salivary gland infection, s/p removal Dr Ezzard Standing    Hemorrhoid    h/o pernicious anemia Hemorrhoid psoriasis (2010) osteopenia,  DEXA 4-10 , started Fosamax    Past Surgical History: Reviewed history from 05/26/2009 and no changes required. Cholecystectomy tubal ligation Hysterectomy, no oophorectomy Salivary gland & LN resection 2002 ,Dr Ezzard Standing CAROTID ENDARTERECTOMY, RIGHT 2,000 -- due to trauma Appendectomy 12-10 extensive cervical spine surgery, decompression, allograft placement  Social History: Reviewed history from 04/20/2008 and no changes required. 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  Physical Exam  General:  alert, well-developed, and well-nourished.   Lungs:  normal respiratory effort, no intercostal retractions, no accessory muscle use, and normal breath sounds.   Heart:  normal rate, regular rhythm, no murmur, and no gallop.   Extremities:  no lower extremity edema Right wrist has a three-quarter centimeter soft mass consistent with a ganglion cyst Skin:  few,  round, red slightly scaly patches in the legs   Impression & Recommendations:  Problem # 1:  SHOULDER PAIN (ICD-719.41) continue with shoulder pain, initially thought to be due to neck pain.  she had neck surgery already, neck pain is corrected and shoulder pain persists Will see Dr. Ethelene Hal  tomorrow, likely will need a orthopedic surgery referral Her updated medication list for this problem includes:     Aspir-low 81 Mg Tbec (Aspirin) .Marland Kitchen... Daily  Problem # 2:  OSTEOPENIA (ICD-733.90) patient has osteopenia, on Fosamax for prevention Wonder if Fosamax cause heart attacks.... that's unlikely We agreed to continue with Fosamax for now Her updated medication list for this problem includes:    Alendronate Sodium 70 Mg Tabs (Alendronate sodium) .Marland Kitchen... 1 by mouth qwk  Problem # 3:  HYPERTENSION (ICD-401.9) at goal  Her updated medication list for this problem includes:    Hydrochlorothiazide 25 Mg Tabs (Hydrochlorothiazide) .Marland Kitchen... 1 by mouth once daily    Diovan 160 Mg Tabs (Valsartan) .Marland Kitchen... 1 by mouth once daily. needs office visit.    Felodipine 5 Mg Tb24 (Felodipine) .Marland Kitchen... 1 by mouth once daily. needs office visit.  Orders: Venipuncture (16109) TLB-BMP (Basic Metabolic Panel-BMET) (80048-METABOL)  BP today: 128/82 Prior BP: 148/80 (09/29/2009)  Labs Reviewed: K+: 3.9 (05/27/2009) Creat: : 1.2 (05/27/2009)   Chol: 150 (11/18/2008)   HDL: 66.80 (11/18/2008)   LDL: 67 (11/18/2008)   TG: 79.0 (11/18/2008)  Problem # 4:  HYPERLIPIDEMIA (ICD-272.4) no change Her updated medication list for this problem includes:    Crestor 40 Mg Tabs (Rosuvastatin calcium) .Marland Kitchen... 1 by mouth qhs  Labs Reviewed: SGOT: 29 (05/27/2009)   SGPT: 19 (05/27/2009)   HDL:66.80 (11/18/2008), 68.9 (01/01/2008)  LDL:67 (11/18/2008), 64 (01/01/2008)  Chol:150 (11/18/2008), 149 (01/01/2008)  Trig:79.0 (11/18/2008), 80 (01/01/2008)  Problem # 5:  PSORIASIS (ICD-696.1) Will prescribe clobetasol as needed, see ROS  Complete Medication List: 1)  Hydrochlorothiazide 25 Mg Tabs (Hydrochlorothiazide) .Marland Kitchen.. 1 by mouth once daily 2)  Crestor 40 Mg Tabs (Rosuvastatin calcium) .Marland Kitchen.. 1 by mouth qhs 3)  Diovan 160 Mg Tabs (Valsartan) .Marland Kitchen.. 1 by mouth once daily. needs office visit. 4)  Flonase 50 Mcg/act Susp (Fluticasone propionate) .... 2 sprays each nostril once daily 5)  Felodipine 5 Mg Tb24 (Felodipine) .Marland Kitchen.. 1 by mouth once  daily. needs office visit. 6)  Cyanocobalamin 1000 Mcg/ml Inj Soln (Cyanocobalamin) .Marland Kitchen.. 1000 micrograms qmo 7)  Alendronate Sodium 70 Mg Tabs (Alendronate sodium) .Marland Kitchen.. 1 by mouth qwk 8)  Aspir-low 81 Mg Tbec (Aspirin) .... Daily 9)  Klonopin 0.5 Mg Tabs (Clonazepam) .Marland Kitchen.. 1 by mouth two times a day; 2 by mouth at bedtime as needed anxiety-insomnia 10)  Clobetasol Propionate 0.05 % Crea (Clobetasol propionate) .... Apply a small amount to affected areas two times a day  Patient Instructions: 1)  Please schedule a follow-up appointment in 4 months, fasting, physical exam Prescriptions: CLOBETASOL PROPIONATE 0.05 % CREA (CLOBETASOL PROPIONATE) apply a small amount to affected areas two times a day  #1 x 1   Entered and Authorized by:   Elita Quick E. Paz MD   Signed by:   Nolon Rod. Paz MD on 10/17/2009   Method used:   Print then Give to Patient   RxID:   [id number]

## 2010-04-04 NOTE — Consult Note (Signed)
Summary: [facility]  [facility]   Imported By: Lanelle Bal 11/16/2009 11:32:08  _____________________________________________________________________  External Attachment:    Type:   Image     Comment:   External Document

## 2010-04-04 NOTE — Progress Notes (Signed)
Summary: CLEARANCE FOR SURGERY PAPEREWORK TO COMPLETE  Phone Note Call from Patient Call back at Home Phone [phone]   Reason for Call: Privacy/Consent Authorization Summary of Call: PATIENT BROUGHT IN SIMPLE FORM FROM [facility] FOR DR PAZ TO COMPLETE ABOUT HER SCHEDULED SHOULDER SURGERY.    PLEASE COMPLETE AND FAX TO [facility] AT [phone]  PLEASE CALL PATIENT AT [phone] (CELL) AND TELL HER PAPERWORK HAS BEEN FAXED--OK TO LEAVE MESSAGE  TOOK PAPERWORK TO [facility] Initial call taken by: Jerolyn Shin,  November 03, 2009 9:28 AM  Follow-up for Phone Call        Pt was last seen 10/17/09. Army Fossa CMA  November 03, 2009 10:05 AM   Additional Follow-up for Phone Call Additional follow up Details #1::        patiently recently seen by cardiology and me. She is disabled. She is cleared to have her shoulder arthroscopy. form  completed, okay to hold aspirin for few days if needed  Additional Follow-up by: Jose E. Paz MD,  November 03, 2009 1:02 PM    Additional Follow-up for Phone Call Additional follow up Details #2::    Pt is aware. Army Fossa CMA  November 03, 2009 1:07 PM

## 2010-04-04 NOTE — Consult Note (Signed)
Summary: s/p R shoulder surgery, doing ok [facility]   Imported By: Lanelle Bal 01/19/2010 13:47:08  _____________________________________________________________________  External Attachment:    Type:   Image     Comment:   External Document

## 2010-04-04 NOTE — Letter (Signed)
Summary: Surgical Clearance/[facility]  Surgical Clearance/[facility]   Imported By: Lanelle Bal 11/14/2009 13:07:40  _____________________________________________________________________  External Attachment:    Type:   Image     Comment:   External Document

## 2010-04-04 NOTE — Miscellaneous (Signed)
Summary: Orders Update  Clinical Lists Changes  Orders: Added new Test order of Carotid Duplex (Carotid Duplex) - Signed 

## 2010-04-04 NOTE — Assessment & Plan Note (Signed)
Summary: yearly/sl   Visit Type:  1 yr f/u Primary Provider:  Willow [name]  CC:  sob when she walks too fast...no other complaints today.  History of Present Illness: [name] returns today for evaluation and management of her history of carotid artery disease, history of carotid bypass graft from traumatic injury, hyperlipidemia, hypertension.  She denies any symptoms of TIAs or mini strokes. She's had no angina or ischemic symptoms. Carotid Dopplers today are stable.  Blood work was checked by Dr. Drue [name]. Blood work in March was stable. Have reviewed this with her today.  Medications reviewed. She is very compliant.    Current Medications (verified): 1)  Hydrochlorothiazide 25 Mg Tabs (Hydrochlorothiazide) .Marland Kitchen.. 1 By Mouth Once Daily 2)  Crestor 40 Mg  Tabs (Rosuvastatin Calcium) .Marland Kitchen.. 1 By Mouth Qhs 3)  Diovan 160 Mg Tabs (Valsartan) .Marland Kitchen.. 1 By Mouth Once Daily. Needs Office Visit. 4)  Flonase 50 Mcg/act  Susp (Fluticasone Propionate) .... 2 Sprays Each Nostril Once Daily 5)  Felodipine 5 Mg  Tb24 (Felodipine) .Marland Kitchen.. 1 By Mouth Once Daily. Needs Office Visit. 6)  Cyanocobalamin 1000 Mcg/ml Inj Soln (Cyanocobalamin) .Marland Kitchen.. 1000 Micrograms Qmo 7)  Alendronate Sodium 70 Mg Tabs (Alendronate Sodium) .Marland Kitchen.. 1 By Mouth Qwk 8)  Aspir-Low 81 Mg Tbec (Aspirin) .... Daily 9)  Klonopin 0.5 Mg Tabs (Clonazepam) .Marland Kitchen.. 1 By Mouth Two Times A Day; 2 By Mouth At Bedtime As Needed Anxiety-Insomnia  Allergies: 1)  ! Morphine Sulfate (Morphine Sulfate) 2)  ! Flexeril (Cyclobenzaprine Hcl) 3)  ! * Clarithromycin 4)  ! Amoxicillin 5)  ! Tramadol Hcl 6)  ! Codeine 7)  ! Hydrocodone  Past History:  Past Medical History: Last updated: 05/26/2009 Coronary artery disease, stress test 2004 "fix defect"    Cerebrovascular accident 2000,   CAROTID ENDARTERECTOMY, RIGHT,  -- due to trauma    Hyperlipidemia    Hypertension    Allergic rhinitis- Skin test 04/20/08    Recurrent rhinosinusitis    Several  pneumonias and otitis  as a child.    Salivary gland infection, s/p removal Dr Tammy [name]    Hemorrhoid    h/o pernicious anemia Hemorrhoid psoriasis (2010) osteopenia,  DEXA 4-10 , started Fosamax    Past Surgical History: Last updated: 05/26/2009 Cholecystectomy tubal ligation Hysterectomy, no oophorectomy Salivary gland & LN resection 2002 ,Dr Tammy [name] CAROTID ENDARTERECTOMY, RIGHT 2,000 -- due to trauma Appendectomy 12-10 extensive cervical spine surgery, decompression, allograft placement  Family History: Last updated: 06/17/2008 Hypertension-- M hyperlipidema--M CAD--F , brother CABG age 109 parkinson--F breast ca--no colon ca--no  Social History: Last updated: 04/20/2008 Former Smoker-1994 Alcohol use-no Single/ divorced/ lives alone 3 children 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  Risk Factors: Alcohol Use: 0 (06/17/2008) Exercise: yes (06/17/2008)  Risk Factors: Smoking Status: quit (06/17/2008)  Review of Systems       negative other than history of present illness  Vital Signs:  Patient profile:   76 year old female Height:      64.25 inches Weight:      158 pounds BMI:     27.01 Pulse rate:   55 / minute Pulse rhythm:   irregular BP sitting:   148 / 80  (left arm) Cuff size:   large  Vitals Entered By: Tammy [name], CMA (September 29, 2009 4:20 PM)  Physical Exam  General:  younger than stated age in appearance, no acute distress Head:  normocephalic and atraumatic Eyes:  PERRLA/EOM intact; conjunctiva and lids normal. Neck:  decreased  ROM.  right carotid scar Chest Tammy [name]:  no deformities or breast masses noted Lungs:  Clear bilaterally to auscultation and percussion. Heart:  Non-displaced PMI, chest non-tender; regular rate and rhythm, S1, S2 without murmurs, rubs or gallops. Carotid upstroke normal, right carotid bruitNormal abdominal aortic size, no bruits. Femorals normal pulses, no bruits. Pedals normal pulses. No edema, no  varicosities. Abdomen:  Bowel sounds positive; abdomen soft and non-tender without masses, organomegaly, or hernias noted. No hepatosplenomegaly. Msk:  decreased ROM.   Pulses:  pulses normal in all 4 extremities Extremities:  No clubbing or cyanosis. Neurologic:  Alert and oriented x 3. Skin:  Intact without lesions or rashes. Psych:  Normal affect.   EKG  Procedure date:  09/29/2009  Findings:      sinus bradycardia, poor R wave progression, no acute changes  Impression & Recommendations:  Problem # 1:  CAROTID ARTERY STENOSIS (ICD-433.10) Assessment Unchanged  Her updated medication list for this problem includes:    Aspir-low 81 Mg Tbec (Aspirin) .Marland Kitchen... Daily  Problem # 2:  HYPERTENSION (ICD-401.9)  Her updated medication list for this problem includes:    Hydrochlorothiazide 25 Mg Tabs (Hydrochlorothiazide) .Marland Kitchen... 1 by mouth once daily    Diovan 160 Mg Tabs (Valsartan) .Marland Kitchen... 1 by mouth once daily. needs office visit.    Felodipine 5 Mg Tb24 (Felodipine) .Marland Kitchen... 1 by mouth once daily. needs office visit.    Aspir-low 81 Mg Tbec (Aspirin) .Marland Kitchen... Daily  Orders: EKG w/ Interpretation (93000)  Problem # 3:  HYPERLIPIDEMIA (ICD-272.4)  Her updated medication list for this problem includes:    Crestor 40 Mg Tabs (Rosuvastatin calcium) .Marland Kitchen... 1 by mouth qhs  Problem # 4:  CAROTID ENDARTERECTOMY, RIGHT, HX OF (ICD-V15.1) Assessment: Unchanged  Problem # 5:  CEREBROVASCULAR ACCIDENT, HX OF (ICD-V12.50) Assessment: Unchanged  Patient Instructions: 1)  Your physician recommends that you schedule a follow-up appointment in: YEAR WITH DR Tammy [name] 2)  Your physician recommends that you continue on your current medications as directed. Please refer to the Current Medication list given to you today.

## 2010-04-04 NOTE — Miscellaneous (Signed)
Summary: PT Order/[facility]  PT Order/[facility]   Imported By: Lanelle Bal 04/12/2009 09:31:20  _____________________________________________________________________  External Attachment:    Type:   Image     Comment:   External Document

## 2010-04-06 NOTE — Assessment & Plan Note (Signed)
Summary: fasting cpx/kn   Vital Signs:  Patient profile:   76 year old female Height:      64.25 inches Weight:      164 pounds Pulse rate:   78 / minute Pulse rhythm:   regular BP sitting:   138 / 84  (left arm) Cuff size:   large  Vitals Entered By: Army Fossa CMA (February 20, 2010 8:10 AM) CC: CPX, fasting- Does she need PAP?  Comments Discuss fosamax Inflammation where she had surgery- has been using volataren. Flu shot? due for mammo    History of Present Illness: Here for Medicare AWV:  1.   Risk factors based on Past M, S, F history: reviewed  2.   Physical Activities: before shoulder surgery 11-2009 was more active 3.   Depression/mood: denies problems, good spirits . Klonopin helps  4.   Hearing: no problems reported or noted  5.   ADL's: totally independent 6.   Fall Risk: low risk, no h/o falls  7.   Home Safety: does feel safe at home 8.   Height, weight, &visual acuity: see VS, wears contacts, doing well  9.   Counseling: yes, see below 10.   Labs ordered based on risk factors: yes 11.           Referral Coordination, if needed 12.           Care Plan-- see a/p 13.            Cognitive Assessment, motor skills, memory and balance seems stable   in addition, we discussed the following issues: 3 days ago developed a sharp burning pain at the right flank in a dermatomal distribution. Even "the skin hurts when I put my clothes on"  R shoulder surgery, PSH updated, still hurting, ok NSAIDS?    Hyperlipidemia-- good medication compliance      Hypertension-- no ambulatory BPs   osteopenia, started Fosamax last year, good medication compliance w/ precautions       Preventive Screening-Counseling & Management  Caffeine-Diet-Exercise     Does Patient Exercise: no  Current Medications (verified): 1)  Hydrochlorothiazide 25 Mg Tabs (Hydrochlorothiazide) .Marland Kitchen.. 1 By Mouth Once Daily 2)  Crestor 40 Mg  Tabs (Rosuvastatin Calcium) .Marland Kitchen.. 1 By Mouth Qhs 3)   Diovan 160 Mg Tabs (Valsartan) .Marland Kitchen.. 1 By Mouth Once Daily. 4)  Flonase 50 Mcg/act  Susp (Fluticasone Propionate) .... 2 Sprays Each Nostril Once Daily 5)  Felodipine 5 Mg  Tb24 (Felodipine) .Marland Kitchen.. 1 By Mouth Once Daily. Needs Office Visit. 6)  Cyanocobalamin 1000 Mcg/ml Inj Soln (Cyanocobalamin) .Marland Kitchen.. 1000 Micrograms Qmo 7)  Alendronate Sodium 70 Mg Tabs (Alendronate Sodium) .Marland Kitchen.. 1 By Mouth Qwk 8)  Aspir-Low 81 Mg Tbec (Aspirin) .... Daily 9)  Klonopin 0.5 Mg Tabs (Clonazepam) .Marland Kitchen.. 1 By Mouth Two Times A Day; 2 By Mouth At Bedtime As Needed Anxiety-Insomnia 10)  Clobetasol Propionate 0.05 % Crea (Clobetasol Propionate) .... Apply A Small Amount To Affected Areas Two Times A Day  Allergies (verified): 1)  ! Morphine Sulfate (Morphine Sulfate) 2)  ! Flexeril (Cyclobenzaprine Hcl) 3)  ! * Clarithromycin 4)  ! Amoxicillin 5)  ! Tramadol Hcl 6)  ! Codeine 7)  ! Hydrocodone  Past History:  Past Medical History: Reviewed history from 05/26/2009 and no changes required. Coronary artery disease, stress test 2004 "fix defect"    Cerebrovascular accident 2000,   CAROTID ENDARTERECTOMY, RIGHT,  -- due to trauma    Hyperlipidemia    Hypertension  Allergic rhinitis- Skin test 04/20/08    Recurrent rhinosinusitis    Several pneumonias and otitis  as a child.    Salivary gland infection, s/p removal Dr Ezzard Standing    Hemorrhoid    h/o pernicious anemia Hemorrhoid psoriasis (2010) osteopenia,  DEXA 4-10 , started Fosamax    Past Surgical History: Cholecystectomy tubal ligation Hysterectomy, no oophorectomy Salivary gland & LN resection 2002 ,Dr Ezzard Standing CAROTID ENDARTERECTOMY, RIGHT 2,000 -- due to trauma Appendectomy 12-10 extensive cervical spine surgery, decompression, allograft placement 11-2009 R shoulder surgery, rotator cuff repair, bicipital tendin repair (Dr Debby Bud)  Family History: Reviewed history from 06/17/2008 and no changes required. Hypertension-- M hyperlipidema--M CAD--F ,  brother CABG age 67 parkinson--F breast ca--no colon ca--no  Social History: Reviewed history from 04/20/2008 and no changes required. 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 Does Patient Exercise:  no  Review of Systems CV:  Denies chest pain or discomfort and swelling of feet. Resp:  Denies cough and shortness of breath. GI:  Denies bloody stools, diarrhea, and nausea. GU:  Denies discharge, dysuria, urinary frequency, and urinary hesitancy.  Physical Exam  General:  alert, well-developed, and well-nourished.   Breasts:  No mass, nodules, thickening, tenderness, bulging, retraction, inflamation, nipple discharge or skin changes noted.  no LADs Lungs:  normal respiratory effort, no intercostal retractions, no accessory muscle use, and normal breath sounds.   Heart:  normal rate, regular rhythm, no murmur, and no gallop.   Abdomen:  soft, non-tender, no distention, no masses, no guarding, and no rigidity.   Genitalia:  normal introitus, no external lesions, and no vaginal discharge.  some vaginal dryness and atrophy. Bilateral manual  exam without mass or discomfort Extremities:  no pretibial edema bilaterally  Skin:  careful inspection of the skin at the right flank show a single blister Psych:  Oriented X3, memory intact for recent and remote, normally interactive, good eye contact, not anxious appearing, and not depressed appearing.     Impression & Recommendations:  Problem # 1:  HEALTH SCREENING (ICD-V70.0)  Td 2003 flu shot next week (d/t shingles) pneumonia shot 2006 shingles shot -- never had one, I am diagnosing her with shingles today   hysterectmy years ago (d/t bleeding) , no oophorectomy, no h/o abnormal PAPs . Last PAP 12-07 PAP sent today, if normal no further Paps last MMG 10-2009 , normal  breast exam today normal  Cscope 2002 ----> next 2012  diet exercise discussed      Orders: Medicare -1st Annual Wellness Visit [id number])  Problem # 2:  SHINGLES (ICD-053.9) Assessment: New pain  and a blister the right flank. Likely shingles. Extensive discussion in reference to these diagnosis start valtrex, lidoderm patches (intolerant to narcotics)  Problem # 3:  DEGENERATIVE JOINT DISEASE (ICD-715.90) status post extended surgery in her right shoulder,  still hurting Advised patient ok  take to take anti-inflammatories as long as we check her kidney functions in 3 months (motrin 200mg  1 q 4 hours, also rec tylenol) Her updated medication list for this problem includes:    Aspir-low 81 Mg Tbec (Aspirin) .Marland Kitchen... Daily  Problem # 4:  ANXIETY (ICD-300.00) clonazepam helps a great deal Her updated medication list for this problem includes:    Klonopin 0.5 Mg Tabs (Clonazepam) .Marland Kitchen... 1 by mouth two times a day; 2 by mouth at bedtime as needed anxiety-insomnia  Problem # 5:  HYPERTENSION (ICD-401.9) no change Her updated medication list for  this problem includes:    Hydrochlorothiazide 25 Mg Tabs (Hydrochlorothiazide) .Marland Kitchen... 1 by mouth once daily    Diovan 160 Mg Tabs (Valsartan) .Marland Kitchen... 1 by mouth once daily.    Felodipine 5 Mg Tb24 (Felodipine) .Marland Kitchen... 1 by mouth once daily. needs office visit.  BP today: 138/84 Prior BP: 128/82 (10/17/2009)  Labs Reviewed: K+: 4.8 (10/19/2009) Creat: : 1.1 (10/19/2009)   Chol: 150 (11/18/2008)   HDL: 66.80 (11/18/2008)   LDL: 67 (11/18/2008)   TG: 79.0 (11/18/2008)  Orders: TLB-BMP (Basic Metabolic Panel-BMET) (80048-METABOL) Specimen Handling (16109)  Problem # 6:  HYPERLIPIDEMIA (ICD-272.4) labs  Her updated medication list for this problem includes:    Crestor 40 Mg Tabs (Rosuvastatin calcium) .Marland Kitchen... 1 by mouth qhs  Labs Reviewed: SGOT: 29 (05/27/2009)   SGPT: 19 (05/27/2009)   HDL:66.80 (11/18/2008), 68.9 (01/01/2008)  LDL:67 (11/18/2008), 64 (01/01/2008)  Chol:150 (11/18/2008), 149 (01/01/2008)  Trig:79.0 (11/18/2008),  80 (01/01/2008)  Orders: TLB-ALT (SGPT) (84460-ALT) TLB-AST (SGOT) (84450-SGOT) TLB-Lipid Panel (80061-LIPID) Specimen Handling (60454)  Complete Medication List: 1)  Hydrochlorothiazide 25 Mg Tabs (Hydrochlorothiazide) .Marland Kitchen.. 1 by mouth once daily 2)  Crestor 40 Mg Tabs (Rosuvastatin calcium) .Marland Kitchen.. 1 by mouth qhs 3)  Diovan 160 Mg Tabs (Valsartan) .Marland Kitchen.. 1 by mouth once daily. 4)  Flonase 50 Mcg/act Susp (Fluticasone propionate) .... 2 sprays each nostril once daily 5)  Felodipine 5 Mg Tb24 (Felodipine) .Marland Kitchen.. 1 by mouth once daily. needs office visit. 6)  Cyanocobalamin 1000 Mcg/ml Inj Soln (Cyanocobalamin) .Marland Kitchen.. 1000 micrograms qmo 7)  Alendronate Sodium 70 Mg Tabs (Alendronate sodium) .Marland Kitchen.. 1 by mouth qwk 8)  Aspir-low 81 Mg Tbec (Aspirin) .... Daily 9)  Klonopin 0.5 Mg Tabs (Clonazepam) .Marland Kitchen.. 1 by mouth two times a day; 2 by mouth at bedtime as needed anxiety-insomnia 10)  Clobetasol Propionate 0.05 % Crea (Clobetasol propionate) .... Apply a small amount to affected areas two times a day 11)  Valtrex 1 Gm Tabs (Valacyclovir hcl) .... One by mouth 3 times a day 12)  Lidoderm 5 % Ptch (Lidocaine) .... Apply 2 patches for 12 hours every day as needed  Other Orders: Venipuncture (09811) T-Vitamin D (25-Hydroxy) (91478-29562)  Patient Instructions: 1)  Please schedule a follow-up appointment in 3 months .  Prescriptions: VALTREX 1 GM TABS (VALACYCLOVIR HCL) One by mouth 3 times a day  #21 x 0   Entered and Authorized by:   Nolon Rod. Paz MD   Signed by:   Nolon Rod. Paz MD on 02/20/2010   Method used:   Print then Give to Patient   RxID:   [id number] LIDODERM 5 % PTCH (LIDOCAINE) apply 2 patches for 12 hours every day as needed  #30 x 0   Entered and Authorized by:   Nolon Rod. Paz MD   Signed by:   Nolon Rod. Paz MD on 02/20/2010   Method used:   Print then Give to Patient   RxID:   [id number]    Orders Added: 1)  Venipuncture [64403] 2)  TLB-BMP (Basic Metabolic Panel-BMET)  [80048-METABOL] 3)  TLB-ALT (SGPT) [84460-ALT] 4)  TLB-AST (SGOT) [84450-SGOT] 5)  TLB-Lipid Panel [80061-LIPID] 6)  T-Vitamin D (25-Hydroxy) [47425-95638] 7)  Specimen Handling [99000] 8)  Est. Patient Level III [75643] 9)  Medicare -1st Annual Wellness Visit [G0438]     Risk Factors:  Exercise:  no

## 2010-04-19 ENCOUNTER — Encounter: Payer: Self-pay | Admitting: Internal Medicine

## 2010-04-19 ENCOUNTER — Ambulatory Visit (INDEPENDENT_AMBULATORY_CARE_PROVIDER_SITE_OTHER): Payer: Medicare Other | Admitting: Internal Medicine

## 2010-04-19 DIAGNOSIS — J069 Acute upper respiratory infection, unspecified: Secondary | ICD-10-CM

## 2010-04-24 ENCOUNTER — Telehealth: Payer: Self-pay | Admitting: Internal Medicine

## 2010-04-26 NOTE — Letter (Signed)
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  Please take this into consideration when reviewing the time away from work/school.    Special Instructions:  [  ] None  [ X ] To be off the remainder of today, returning to the normal work / school schedule tomorrow.  [  ] To be off until the next scheduled appointment on ______________________.  [  ] Other ________________________________________________________________ ________________________________________________________________________   Sincerely yours,   Willow Ora, MD

## 2010-04-26 NOTE — Assessment & Plan Note (Signed)
Summary: sinus since Monday, fever   Vital Signs:  Patient profile:   76 year old female Weight:      159.38 pounds Temp:     99.0 degrees F oral Pulse rate:   80 / minute Pulse rhythm:   regular BP sitting:   126 / 86  (left arm) Cuff size:   large  Vitals Entered By: Army Fossa CMA (April 19, 2010 2:10 PM) CC: c/o head congestion, drainage, earache, HA's  Comments Using [facility]lenol  [facility] [address]    History of Present Illness: symptoms started 3 days ago, head congestion, postnasal dripping, ears feeling congested, frontal headache.  Current Medications (verified): 1)  Hydrochlorothiazide 25 Mg Tabs (Hydrochlorothiazide) .Marland Kitchen.. 1 By Mouth Once Daily 2)  Crestor 40 Mg  Tabs (Rosuvastatin Calcium) .Marland Kitchen.. 1 By Mouth Qhs 3)  Diovan 160 Mg Tabs (Valsartan) .Marland Kitchen.. 1 By Mouth Once Daily. 4)  Flonase 50 Mcg/act  Susp (Fluticasone Propionate) .... 2 Sprays Each Nostril Once Daily 5)  Felodipine 5 Mg  Tb24 (Felodipine) .Marland Kitchen.. 1 By Mouth Once Daily. 6)  Cyanocobalamin 1000 Mcg/ml Inj Soln (Cyanocobalamin) .Marland Kitchen.. 1000 Micrograms Qmo 7)  Alendronate Sodium 70 Mg Tabs (Alendronate Sodium) .Marland Kitchen.. 1 By Mouth Qwk 8)  Aspir-Low 81 Mg Tbec (Aspirin) .... Daily 9)  Klonopin 0.5 Mg Tabs (Clonazepam) .Marland Kitchen.. 1 By Mouth Two Times A Day; 2 By Mouth At Bedtime As Needed Anxiety-Insomnia 10)  Clobetasol Propionate 0.05 % Crea (Clobetasol Propionate) .... Apply A Small Amount To Affected Areas Two Times A Day 11)  Valtrex 1 Gm Tabs (Valacyclovir Hcl) .... One By Mouth 3 Times A Day 12)  Lidoderm 5 % Ptch (Lidocaine) .... Apply 2 Patches For 12 Hours Every Day As Needed  Allergies (verified): 1)  ! Morphine Sulfate (Morphine Sulfate) 2)  ! Flexeril (Cyclobenzaprine Hcl) 3)  ! * Clarithromycin 4)  ! Amoxicillin 5)  ! Tramadol Hcl 6)  ! Codeine 7)  ! Hydrocodone  Past History:  Past Medical History: Coronary artery disease, stress test 2004 "fix defect"    Cerebrovascular accident 2000,   CAROTID  ENDARTERECTOMY, RIGHT,  -- due to trauma    Hyperlipidemia    Hypertension    Allergic rhinitis- Skin test 04/20/08    Recurrent rhinosinusitis    Several pneumonias and otitis  as a child.    Salivary gland infection, s/p removal Dr Ezzard Standing    Hemorrhoid    h/o pernicious anemia Hemorrhoid psoriasis (2010) osteopenia,  DEXA 4-10 , started Fosamax shingles dx 02-2010   Past Surgical History: Reviewed history from 02/20/2010 and no changes required. Cholecystectomy tubal ligation Hysterectomy, no oophorectomy Salivary gland & LN resection 2002 ,Dr Ezzard Standing CAROTID ENDARTERECTOMY, RIGHT 2,000 -- due to trauma Appendectomy 12-10 extensive cervical spine surgery, decompression, allograft placement 11-2009 R shoulder surgery, rotator cuff repair, bicipital tendin repair (Dr Debby Bud)  Review of Systems General:  low grade F  ~ 99 no rash. ENT:   . Resp:  Denies shortness of breath, sputum productive, and wheezing; mild cough x 1 day . GI:  Denies diarrhea, nausea, and vomiting. MS:  Denies muscle aches.  Physical Exam  General:  alert and well-developed.  no apparent distress Head:  face symmetric, not tender to palpation Ears:  R ear normal and L ear normal.   Nose:  congested  Mouth:  no redness or discharge Lungs:  normal respiratory effort, no intercostal retractions, no accessory muscle use, and normal breath sounds.   Heart:  normal rate, regular rhythm,  and no murmur.     Impression & Recommendations:  Problem # 1:  URI (ICD-465.9) see instructions  Her updated medication list for this problem includes:    Aspir-low 81 Mg Tbec (Aspirin) .Marland Kitchen... Daily  Complete Medication List: 1)  Hydrochlorothiazide 25 Mg Tabs (Hydrochlorothiazide) .Marland Kitchen.. 1 by mouth once daily 2)  Crestor 40 Mg Tabs (Rosuvastatin calcium) .Marland Kitchen.. 1 by mouth qhs 3)  Diovan 160 Mg Tabs (Valsartan) .Marland Kitchen.. 1 by mouth once daily. 4)  Flonase 50 Mcg/act Susp (Fluticasone propionate) .... 2 sprays each nostril once  daily 5)  Felodipine 5 Mg Tb24 (Felodipine) .Marland Kitchen.. 1 by mouth once daily. 6)  Cyanocobalamin 1000 Mcg/ml Inj Soln (Cyanocobalamin) .Marland Kitchen.. 1000 micrograms qmo 7)  Alendronate Sodium 70 Mg Tabs (Alendronate sodium) .Marland Kitchen.. 1 by mouth qwk 8)  Aspir-low 81 Mg Tbec (Aspirin) .... Daily 9)  Klonopin 0.5 Mg Tabs (Clonazepam) .Marland Kitchen.. 1 by mouth two times a day; 2 by mouth at bedtime as needed anxiety-insomnia 10)  Clobetasol Propionate 0.05 % Crea (Clobetasol propionate) .... Apply a small amount to affected areas two times a day 11)  Valtrex 1 Gm Tabs (Valacyclovir hcl) .... One by mouth 3 times a day 12)  Lidoderm 5 % Ptch (Lidocaine) .... Apply 2 patches for 12 hours every day as needed  Patient Instructions: 1)  rest, fluids, tylenol 2)  saline nasal irrigation 3)  mucinex DM twice a day until symptoms gone 4)  call if no better in few days 5)  call if symptoms severe   Orders Added: 1)  Est. Patient Level III [16109]

## 2010-05-02 NOTE — Progress Notes (Signed)
Summary: Not better/sinus bleeding  Phone Note Call from Patient Call back at Home Phone [phone]   Complaint: Urinary/GYN Problems Details for Reason: Pharm: [facility] Summary of Call: Patient called noting that she followed MD advisement (Seen 04/19/10) and is now not any better and is not only producing mucous, but produces blood when blowing her nose.  Patient would like to know if you have any recommendations or if prescription should be called in. Please advise. Initial call taken by: Lucious Groves CMA,  April 24, 2010 11:18 AM  Follow-up for Phone Call        sinusitis? call in a zpack call if no better soon Follow-up by: [facility] E. Dellas Guard MD,  April 24, 2010 4:47 PM  Additional Follow-up for Phone Call Additional follow up Details #1::        left message for pt to call back. Army Fossa CMA  April 24, 2010 4:54 PM  Patient notified. Lucious Groves CMA  April 25, 2010 10:34 AM     New/Updated Medications: ZITHROMAX Z-PAK 250 MG TABS (AZITHROMYCIN) as directed Prescriptions: ZITHROMAX Z-PAK 250 MG TABS (AZITHROMYCIN) as directed  #1 x 0   Entered by:   Army Fossa CMA   Authorized by:   Nolon Rod. Pleasant Bensinger MD   Signed by:   Army Fossa CMA on 04/24/2010   Method used:   [facility]ronically to        [facility] [phone]* (retail)       [address]       [place], [place]  [address]       Ph: [phone]       Fax: [phone]   RxID:   [id number]

## 2010-05-22 ENCOUNTER — Ambulatory Visit (INDEPENDENT_AMBULATORY_CARE_PROVIDER_SITE_OTHER): Payer: Medicare Other | Admitting: Internal Medicine

## 2010-05-22 ENCOUNTER — Encounter: Payer: Self-pay | Admitting: Internal Medicine

## 2010-05-22 ENCOUNTER — Other Ambulatory Visit: Payer: Self-pay | Admitting: Internal Medicine

## 2010-05-22 DIAGNOSIS — R109 Unspecified abdominal pain: Secondary | ICD-10-CM

## 2010-05-22 DIAGNOSIS — N189 Chronic kidney disease, unspecified: Secondary | ICD-10-CM

## 2010-05-22 DIAGNOSIS — I10 Essential (primary) hypertension: Secondary | ICD-10-CM

## 2010-05-22 LAB — CONVERTED CEMR LAB
Bilirubin Urine: NEGATIVE
Blood in Urine, dipstick: NEGATIVE
Glucose, Urine, Semiquant: NEGATIVE
Ketones, urine, test strip: NEGATIVE
Nitrite: NEGATIVE
Protein, U semiquant: NEGATIVE
Specific Gravity, Urine: 1.02
Urobilinogen, UA: 0.2
WBC Urine, dipstick: NEGATIVE
pH: 7.5

## 2010-05-22 LAB — BASIC METABOLIC PANEL
BUN: 17 mg/dL (ref 6–23)
CO2: 27 mEq/L (ref 19–32)
Calcium: 9.7 mg/dL (ref 8.4–10.5)
Chloride: 102 mEq/L (ref 96–112)
Creatinine, Ser: 1.3 mg/dL — ABNORMAL HIGH (ref 0.4–1.2)
GFR: 41.48 mL/min — ABNORMAL LOW (ref >60.00)
Glucose, Bld: 95 mg/dL (ref 70–99)
Potassium: 4.4 mEq/L (ref 3.5–5.1)
Sodium: 140 mEq/L (ref 135–145)

## 2010-05-23 ENCOUNTER — Encounter: Payer: Self-pay | Admitting: Internal Medicine

## 2010-05-23 LAB — CONVERTED CEMR LAB
Casts: NONE SEEN /lpf
Crystals: NONE SEEN
RBC / HPF: NONE SEEN (ref <3)
Squamous Epithelial / HPF: NONE SEEN /lpf

## 2010-05-24 LAB — URINE CULTURE: Colony Count: 30000

## 2010-06-01 NOTE — Assessment & Plan Note (Signed)
Summary: 3 MONTH ROV/SPH   Vital Signs:  Patient profile:   76 year old female Weight:      162 pounds Pulse rate:   64 / minute Pulse rhythm:   regular BP sitting:   126 / 80  (left arm) Cuff size:   large  Vitals Entered By: Army Fossa CMA (May 22, 2010 9:14 AM) CC: 3 month OV- fasting  Comments [facility] [place]    History of Present Illness: ROV c/o a suprapubic pressure x 6 weeks, after he went back to work (stands all day). standing seems to make it worse  some dyscomfort at the inner legs  close to the groin  ROS no dysuria, no hematuria BMs normal, no blood in stools , no diarrhea  ambulatory BPs wnl good medication compliance w/ crestor no CP-SOB good medication compliance w/  fosamax-ca -vit d      Current Medications (verified): 1)  Hydrochlorothiazide 25 Mg Tabs (Hydrochlorothiazide) .Marland Kitchen.. 1 By Mouth Once Daily 2)  Crestor 40 Mg  Tabs (Rosuvastatin Calcium) .Marland Kitchen.. 1 By Mouth Qhs 3)  Diovan 160 Mg Tabs (Valsartan) .Marland Kitchen.. 1 By Mouth Once Daily. 4)  Flonase 50 Mcg/act  Susp (Fluticasone Propionate) .... 2 Sprays Each Nostril Once Daily 5)  Felodipine 5 Mg  Tb24 (Felodipine) .Marland Kitchen.. 1 By Mouth Once Daily. 6)  Cyanocobalamin 1000 Mcg/ml Inj Soln (Cyanocobalamin) .Marland Kitchen.. 1000 Micrograms Qmo 7)  Alendronate Sodium 70 Mg Tabs (Alendronate Sodium) .Marland Kitchen.. 1 By Mouth Qwk 8)  Aspir-Low 81 Mg Tbec (Aspirin) .... Daily 9)  Klonopin 0.5 Mg Tabs (Clonazepam) .Marland Kitchen.. 1 By Mouth Two Times A Day; 2 By Mouth At Bedtime As Needed Anxiety-Insomnia 10)  Clobetasol Propionate 0.05 % Crea (Clobetasol Propionate) .... Apply A Small Amount To Affected Areas Two Times A Day 11)  Valtrex 1 Gm Tabs (Valacyclovir Hcl) .... One By Mouth 3 Times A Day 12)  Lidoderm 5 % Ptch (Lidocaine) .... Apply 2 Patches For 12 Hours Every Day As Needed  Allergies (verified): 1)  ! Morphine Sulfate (Morphine Sulfate) 2)  ! Flexeril (Cyclobenzaprine Hcl) 3)  ! * Clarithromycin 4)  ! Amoxicillin 5)  ! Tramadol  Hcl 6)  ! Codeine 7)  ! Hydrocodone  Past History:  Past Medical History: Coronary artery disease, stress test 2004 "fix defect" Cerebrovascular accident 2000,   CAROTID ENDARTERECTOMY, RIGHT,  -- due to trauma Hyperlipidemia Hypertension Allergic rhinitis- Skin test 04/20/08 Recurrent rhinosinusitis Several pneumonias and otitis  as a child. Salivary gland infection, s/p removal Dr Ezzard Standing    Hemorrhoid    h/o pernicious anemia Hemorrhoid psoriasis (2010) osteopenia,  DEXA 4-10 , started Fosamax shingles dx 02-2010   Past Surgical History: Reviewed history from 02/20/2010 and no changes required. Cholecystectomy tubal ligation Hysterectomy, no oophorectomy Salivary gland & LN resection 2002 ,Dr Ezzard Standing CAROTID ENDARTERECTOMY, RIGHT 2,000 -- due to trauma Appendectomy 12-10 extensive cervical spine surgery, decompression, allograft placement 11-2009 R shoulder surgery, rotator cuff repair, bicipital tendin repair (Dr Debby Bud)  Physical Exam  General:  alert, well-developed, and well-nourished.   Abdomen:  soft, no distention, no masses, no guarding, and no rigidity.  mild dyscomfort at the suprapubic area, no mass or rebound. no CVA tenderness  Extremities:  trace B  pretibial edema bilaterally    Impression & Recommendations:  Problem # 1:  SUPRAPUBIC PAIN (ICD-789.09)  GU-GI ROS neg  ua neg, UCX pending plan observation Her updated medication list for this problem includes:    Aspir-low 81 Mg Tbec (Aspirin) .Marland Kitchen... Daily  Orders: UA Dipstick w/o Micro (manual) (04540) T-Culture, Urine (98119-14782) T-Urine Microscopic (95621-30865)  Problem # 2:  HYPERTENSION (ICD-401.9) at goal  Her updated medication list for this problem includes:    Hydrochlorothiazide 25 Mg Tabs (Hydrochlorothiazide) .Marland Kitchen... 1 by mouth once daily    Diovan 160 Mg Tabs (Valsartan) .Marland Kitchen... 1 by mouth once daily.    Felodipine 5 Mg Tb24 (Felodipine) .Marland Kitchen... 1 by mouth once daily.  BP today:  126/80 Prior BP: 126/86 (04/19/2010)  Labs Reviewed: K+: 4.9 (02/20/2010) Creat: : 1.3 (02/20/2010)   Chol: 162 (02/20/2010)   HDL: 69.90 (02/20/2010)   LDL: 77 (02/20/2010)   TG: 77.0 (02/20/2010)  Problem # 3:  RENAL INSUFFICIENCY, CHRONIC (ICD-585.9)  recheck a BMP  Labs Reviewed: BUN: 20 (02/20/2010)   Cr: 1.3 (02/20/2010)    Hgb: 13.2 (05/27/2009)   Hct: 40.8 (05/27/2009)   Ca++: 9.5 (02/20/2010)     Orders: Venipuncture (78469) TLB-BMP (Basic Metabolic Panel-BMET) (80048-METABOL) Specimen Handling (62952)  Complete Medication List: 1)  Hydrochlorothiazide 25 Mg Tabs (Hydrochlorothiazide) .Marland Kitchen.. 1 by mouth once daily 2)  Crestor 40 Mg Tabs (Rosuvastatin calcium) .Marland Kitchen.. 1 by mouth qhs 3)  Diovan 160 Mg Tabs (Valsartan) .Marland Kitchen.. 1 by mouth once daily. 4)  Flonase 50 Mcg/act Susp (Fluticasone propionate) .... 2 sprays each nostril once daily 5)  Felodipine 5 Mg Tb24 (Felodipine) .Marland Kitchen.. 1 by mouth once daily. 6)  Cyanocobalamin 1000 Mcg/ml Inj Soln (Cyanocobalamin) .Marland Kitchen.. 1000 micrograms qmo 7)  Alendronate Sodium 70 Mg Tabs (Alendronate sodium) .Marland Kitchen.. 1 by mouth qwk 8)  Aspir-low 81 Mg Tbec (Aspirin) .... Daily 9)  Klonopin 0.5 Mg Tabs (Clonazepam) .Marland Kitchen.. 1 by mouth two times a day; 2 by mouth at bedtime as needed anxiety-insomnia 10)  Clobetasol Propionate 0.05 % Crea (Clobetasol propionate) .... Apply a small amount to affected areas two times a day 11)  Valtrex 1 Gm Tabs (Valacyclovir hcl) .... One by mouth 3 times a day 12)  Lidoderm 5 % Ptch (Lidocaine) .... Apply 2 patches for 12 hours every day as needed  Patient Instructions: 1)  Please schedule a follow-up appointment in 4  months . Physical exam   Orders Added: 1)  Venipuncture [36415] 2)  TLB-BMP (Basic Metabolic Panel-BMET) [80048-METABOL] 3)  Specimen Handling [99000] 4)  UA Dipstick w/o Micro (manual) [81002] 5)  T-Culture, Urine [[id number]] 6)  T-Urine Microscopic [[id number]] 7)  Est. Patient Level III  [34742]    Laboratory Results   Urine Tests    Routine Urinalysis   Color: yellow Appearance: Clear Glucose: negative   (Normal Range: Negative) Bilirubin: negative   (Normal Range: Negative) Ketone: negative   (Normal Range: Negative) Spec. Gravity: 1.020   (Normal Range: 1.003-1.035) Blood: negative   (Normal Range: Negative) pH: 7.5   (Normal Range: 5.0-8.0) Protein: negative   (Normal Range: Negative) Urobilinogen: 0.2   (Normal Range: 0-1) Nitrite: negative   (Normal Range: Negative) Leukocyte Esterace: negative   (Normal Range: Negative)    Comments: Army Fossa CMA  May 22, 2010 9:43 AM

## 2010-06-15 ENCOUNTER — Other Ambulatory Visit: Payer: Self-pay | Admitting: *Deleted

## 2010-06-15 LAB — COMPREHENSIVE METABOLIC PANEL
ALT: 19 U/L (ref 0–35)
AST: 27 U/L (ref 0–37)
Albumin: 3.9 g/dL (ref 3.5–5.2)
Alkaline Phosphatase: 89 U/L (ref 39–117)
BUN: 24 mg/dL — ABNORMAL HIGH (ref 6–23)
CO2: 25 mEq/L (ref 19–32)
Calcium: 8.6 mg/dL (ref 8.4–10.5)
Chloride: 107 mEq/L (ref 96–112)
Creatinine, Ser: 1 mg/dL (ref 0.4–1.2)
GFR calc Af Amer: >60 mL/min (ref >60)
GFR calc non Af Amer: 55 mL/min — ABNORMAL LOW (ref >60)
Glucose, Bld: 133 mg/dL — ABNORMAL HIGH (ref 70–99)
Potassium: 3.9 mEq/L (ref 3.5–5.1)
Sodium: 143 mEq/L (ref 135–145)
Total Bilirubin: 0.9 mg/dL (ref 0.3–1.2)
Total Protein: 6.8 g/dL (ref 6.0–8.3)

## 2010-06-15 NOTE — Telephone Encounter (Signed)
Ok 90 and 1 RF 

## 2010-06-15 NOTE — Telephone Encounter (Signed)
Last filled 09/16/09 90 x 1. Last OV- 05/22/10

## 2010-06-16 MED ORDER — CLONAZEPAM 1 MG PO TABS: ORAL_TABLET | ORAL | Status: DC

## 2010-06-28 ENCOUNTER — Other Ambulatory Visit: Payer: Self-pay | Admitting: Internal Medicine

## 2010-07-04 ENCOUNTER — Other Ambulatory Visit: Payer: Self-pay | Admitting: Internal Medicine

## 2010-07-21 NOTE — Letter (Signed)
May 07, 2007    [name]  [address]  [place], [place] [address]   RE:  [name], [name]  MRN:  [id number]  /  DOB:  1934/12/19   To Whom It May Concern:   Ms. Tammy [name] is a patient of mine.  She needs aggressive  lowering of her lipids because of known cerebrovascular disease.  On  Vytorin 10/80, the maximum dose, her lipids have been the best  controlled.  This, in my professional opinion, is necessary to prevent  further progression of her carotid disease, not to mention other  vascular disease.   Thank you for your attention to this matter.    Sincerely,      Jesse Sans. Daleen Squibb, MD, [facility]  Electronically Signed    TCW/MedQ  DD: 05/07/2007  DT: 05/07/2007  Job #: [id number]

## 2010-07-21 NOTE — Op Note (Signed)
[facility]  Patient:    Tammy [name], Tammy [name]                     MRN: [id number] Proc. Date: 07/30/00 Adm. Date:  [id number] Attending:  Carlean Purl                           Operative Report  PREOPERATIVE DIAGNOSES: 1. Chronic right submandibular sialoadenitis. 2. Enlarged right submandibular lymph node.  POSTOPERATIVE DIAGNOSES: 1. Chronic right submandibular sialoadenitis. 2. Enlarged right submandibular lymph node.  PROCEDURES: 1. Excision of right submandibular gland. 2. Excision of right submandibular lymph node.  SURGEON:  Kristine Garbe. Ezzard Standing, M.D.  ANESTHESIA:  General endotracheal.  COMPLICATIONS:  None.  BRIEF CLINICAL NOTE:  Tammy [name] is a 76 year old female who has had history of chronic right submandibular swelling and recurrent right submandibular gland for over four years.  She is taken to the operating room at this time for excision of right submandibular gland.  DESCRIPTION OF PROCEDURE:  After adequate endotracheal anesthesia, patient received 1 g of Ancef IV intraoperatively.  Right neck was prepped with Betadine solution and draped with sterile towels.  An incision was made approximately two fingerbreadths below the edge of the mandible on the right side just at the inferior border of the submandibular gland on the right side. The branch of the anterior facial vein was identified, was retracted superiorly.  The submandibular gland was identified at its inferior margin. The vein was retracted superiorly, and dissection was carried out directly on the submandibular gland so as not to damage the marginal nerve.  The facial vein and artery were identified posterior, and the branch of the artery to the submandibular gland was ligated with 3-0 silk ligature and transected.  The gland was dissected out of the submandibular fossa.  The submandibular duct and the branch of the lingual nerve were identified  and were ligated with 2-0 silk ligature and divided.  The gland was dissected out and sent to pathology in formalin.  There was a separate submandibular node located at the posterior margin of the submandibular gland, which measured approximately 1.5 cm in size, and this was dissected out and sent as a separate specimen in saline to pathology.  After attaining adequate hemostasis, the wound was closed with 3-0 chromic sutures to reapproximate the platysma muscle and subcutaneous tissue and 5-0 nylon on the skin.  Bacitracin ointment was applied, and a dressing was applied.  Patient was awoken from anesthesia and transferred to recovery room postoperatively doing well.  DISPOSITION:  Tammy [name] is discharged home.  We will have her follow up in my office in one week for recheck to have sutures removed.  She is instructed to use Tylenol p.r.n. pain as well as Demerol one to two tablets p.r.n. more severe pain.  She is also given Keflex 500 mg b.i.d. for four days. DD:  07/30/00 TD:  07/30/00 Job: [id number]

## 2010-07-21 NOTE — Assessment & Plan Note (Signed)
[facility]                            CARDIOLOGY OFFICE NOTE   [name], Tammy [name]                   MRN:          [id number]  DATE:06/06/2006                            DOB:          1934-06-02    Tammy [name] returns today for further management of her carotid artery  stenosis.  She is status post right carotid endarterectomy.  She has  numerous cardiac risk factors which are being aggressively and  appropriately managed with Dr. Drue Novel.  She is very compliant.   She is asymptomatic.   MEDICATIONS:  1. Vytorin 10/80.  2. Aspirin daily.  3. Diovan 160 daily.  4. Calcium daily.  5. Hyoscyamine 160 daily.  6. HCTZ 12.5 daily.  7. Vitamin B12 injection q. month.  8. Protonix 40 mg q.a.m.   Her blood pressure today is 130/62, her pulse is 65 and regular, weight  is 148.  She is in no acute distress.  SKIN:  Warm and dry.  HEENT:  Normocephalic/atraumatic, PERRLA, extraocular movements intact,  sclerae are clear.  Facial symmetry is normal.  NECK:  Supple, carotid upstrokes are equal bilaterally with soft  systolic sound on the right.  Thyroid is not enlarged, trachea is  midline.  LUNGS:  Clear.  HEART:  Reveals a nondisplaced PMI, she has normal S1, S2 without  murmur, rub, or gallop.  ABDOMINAL:  Exam is soft.  EXTREMITIES:  Reveal no cyanosis, clubbing, or edema.  Pulses are  intact.  NEURO:  Exam is intact.   Her EKG demonstrates sinus rhythm with PACs.  She has a poor R wave  progression across the anterior precordium which is unchanged.  She has  had a negative stress echo in the past.   Carotid Dopplers May 30, 2006 show some regression of her plaque  bilaterally with 0% - 39% bilateral internal carotid artery stenosis.  This was up to 59% in 2006.  She has antegrade flow in both vertebrals.   ASSESSMENT/PLAN:  Tammy [name] is doing well.  I think aggressive risk  factor modification has paid off.  She certainly is no  worse, and  perhaps better.  Her right carotid endarterectomy site is stable.   I have made no changes in her medical program.  We will see her back  again in 2 years.     Thomas C. Daleen Squibb, MD, [facility]  Electronically Signed    TCW/MedQ  DD: 06/06/2006  DT: 06/06/2006  Job #: [id number]   cc:   Willow Ora, MD

## 2010-07-28 ENCOUNTER — Other Ambulatory Visit: Payer: Self-pay | Admitting: Internal Medicine

## 2010-08-10 ENCOUNTER — Emergency Department (HOSPITAL_COMMUNITY): Payer: No Typology Code available for payment source

## 2010-08-10 ENCOUNTER — Emergency Department (HOSPITAL_COMMUNITY)
Admission: EM | Admit: 2010-08-10 | Discharge: 2010-08-10 | Disposition: A | Discharge status: Home / Self Care | Payer: No Typology Code available for payment source | Attending: General Surgery | Admitting: General Surgery

## 2010-08-10 DIAGNOSIS — R51 Headache: Secondary | ICD-10-CM | POA: Insufficient documentation

## 2010-08-10 DIAGNOSIS — R110 Nausea: Secondary | ICD-10-CM | POA: Insufficient documentation

## 2010-08-10 DIAGNOSIS — S139XXA Sprain of joints and ligaments of unspecified parts of neck, initial encounter: Secondary | ICD-10-CM | POA: Insufficient documentation

## 2010-08-10 DIAGNOSIS — Y92410 Unspecified street and highway as the place of occurrence of the external cause: Secondary | ICD-10-CM | POA: Insufficient documentation

## 2010-08-10 DIAGNOSIS — I2510 Atherosclerotic heart disease of native coronary artery without angina pectoris: Secondary | ICD-10-CM | POA: Insufficient documentation

## 2010-08-10 DIAGNOSIS — Y998 Other external cause status: Secondary | ICD-10-CM | POA: Insufficient documentation

## 2010-08-10 DIAGNOSIS — Z79899 Other long term (current) drug therapy: Secondary | ICD-10-CM | POA: Insufficient documentation

## 2010-08-10 DIAGNOSIS — I10 Essential (primary) hypertension: Secondary | ICD-10-CM | POA: Insufficient documentation

## 2010-08-10 DIAGNOSIS — Z7982 Long term (current) use of aspirin: Secondary | ICD-10-CM | POA: Insufficient documentation

## 2010-08-10 DIAGNOSIS — Z8673 Personal history of transient ischemic attack (TIA), and cerebral infarction without residual deficits: Secondary | ICD-10-CM | POA: Insufficient documentation

## 2010-08-10 DIAGNOSIS — I6529 Occlusion and stenosis of unspecified carotid artery: Secondary | ICD-10-CM | POA: Insufficient documentation

## 2010-08-11 ENCOUNTER — Other Ambulatory Visit: Payer: Self-pay | Admitting: Internal Medicine

## 2010-08-11 MED ORDER — CLOBETASOL PROPIONATE 0.05 % EX CREA: TOPICAL_CREAM | Freq: Two times a day (BID) | CUTANEOUS | Status: DC

## 2010-08-11 NOTE — Telephone Encounter (Signed)
done

## 2010-08-12 ENCOUNTER — Encounter: Payer: Self-pay | Admitting: Gastroenterology

## 2010-08-18 ENCOUNTER — Other Ambulatory Visit: Payer: Self-pay | Admitting: Internal Medicine

## 2010-08-18 NOTE — Telephone Encounter (Signed)
1 month each, due for OV

## 2010-09-14 ENCOUNTER — Other Ambulatory Visit: Payer: Self-pay | Admitting: Internal Medicine

## 2010-09-15 ENCOUNTER — Other Ambulatory Visit: Payer: Self-pay | Admitting: Internal Medicine

## 2010-09-20 ENCOUNTER — Encounter: Payer: Self-pay | Admitting: Internal Medicine

## 2010-09-21 ENCOUNTER — Ambulatory Visit (INDEPENDENT_AMBULATORY_CARE_PROVIDER_SITE_OTHER): Payer: Medicare Other | Admitting: Internal Medicine

## 2010-09-21 ENCOUNTER — Encounter: Payer: Self-pay | Admitting: Internal Medicine

## 2010-09-21 VITALS — BP 134/82 | HR 68 | Ht 64.5 in | Wt 159.6 lb

## 2010-09-21 DIAGNOSIS — M545 Low back pain, unspecified: Secondary | ICD-10-CM

## 2010-09-21 DIAGNOSIS — I10 Essential (primary) hypertension: Secondary | ICD-10-CM

## 2010-09-21 DIAGNOSIS — Z0000 Encounter for general adult medical examination without abnormal findings: Secondary | ICD-10-CM | POA: Insufficient documentation

## 2010-09-21 DIAGNOSIS — M7130 Other bursal cyst, unspecified site: Secondary | ICD-10-CM | POA: Insufficient documentation

## 2010-09-21 DIAGNOSIS — D510 Vitamin B12 deficiency anemia due to intrinsic factor deficiency: Secondary | ICD-10-CM

## 2010-09-21 DIAGNOSIS — R109 Unspecified abdominal pain: Secondary | ICD-10-CM

## 2010-09-21 LAB — POCT URINALYSIS DIPSTICK
Nitrite, UA: NEGATIVE
Protein, UA: NEGATIVE
Urobilinogen, UA: 0.2
pH, UA: 6.5

## 2010-09-21 MED ORDER — CIPROFLOXACIN HCL 500 MG PO TABS: 500.0000 mg | ORAL_TABLET | Freq: Two times a day (BID) | ORAL | Status: AC

## 2010-09-21 NOTE — Assessment & Plan Note (Signed)
Good compliance with shots, labs 

## 2010-09-21 NOTE — Assessment & Plan Note (Addendum)
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 Referred to orthopedic surgery.

## 2010-09-21 NOTE — Patient Instructions (Signed)
Drink plenty of fluids and take Cipro for one week.

## 2010-09-21 NOTE — Assessment & Plan Note (Signed)
Has decreased temporarily HZTZ to half tablet daily d/t urinary urgency (she has a UTI)  BP still well controlled. Consider go  back to a full tablet if needed

## 2010-09-21 NOTE — Progress Notes (Signed)
  Subjective:    Patient ID: Tammy [name], female    DOB: Jul 16, 1934, 76 y.o.   MRN: [id number]  HPI ROV Good compliance with B12 shots Complaining of right flank pain, on and off, mostly at night, no radiation, no change with bending. It is located at the same place she had shingles in 2011. 4-8 weeks history of frequency at night. Mild dysuria, in addition, she continue with some ill-defined pelvic pressure. Hypertension: She self decreased  hydrochlorothiazide to half tablet daily because  nocturia. BP still wellcontrolled. Hyperlipidemia, good compliance with Crestor Due for colonoscopy per GI, wonders if she should go ahead and schedule it. Has a synovial cyst of the right wrist, is causing discomfort, likes it out  Past Medical History  Diagnosis Date  . CAD (coronary artery disease)     stress test 2003 "fix defect"  . Cerebrovascular accident 2000    Carotid endarterectomy,right-- due to trauma  . Hyperlipidemia   . Hypertension   . Allergic rhinitis     skin test 04/20/08  . Pneumonia     several pneumonias as a child, and otitis  . Salivary gland infection     s/p removal Dr Ezzard Standing  . Hemorrhoid   . Psoriasis 2010  . Osteopenia 4/10    DEXA,started fosamx  . Shingles 02/2010   Past Surgical History  Procedure Date  . Cholecystectomy   . Tubal ligation   . Abdominal hysterectomy     no oophorectomy  . Salivary gland surgery     & LN resection 2002, Dr.Newman  . Carotid endarterectomy     right, 2000 due to trauma   . Appendectomy   . Spine surgery 12/10    extensive, decompression,allograft placement  . Shoulder surgery 11/2009    right, rotator cuff repiar, biciptial tendin repiar (Dr Debby Bud)     Family History: Hypertension-- M hyperlipidema--M CAD--F , brother CABG age 38 parkinson--F breast ca--no colon ca--no  Social History: Former Smoker-1994 Alcohol use-no Single/ divorced/ lives alone 3 children part-time Statistician- Holiday representative- answering  phone  Review of Systems  Constitutional: Negative for fever, fatigue and unexpected weight change.  Respiratory: Negative for cough and shortness of breath.   Cardiovascular: Negative for chest pain and leg swelling.  Gastrointestinal: Negative for nausea, diarrhea and blood in stool.  Genitourinary: Negative for vaginal bleeding and vaginal discharge.       Objective:   Physical Exam  Constitutional: She appears well-developed.  HENT:  Head: Normocephalic.  Cardiovascular: Normal rate, regular rhythm and normal heart sounds.   No murmur heard. Pulmonary/Chest: Effort normal and breath sounds normal. No respiratory distress. She has no wheezes. She has no rales.  Abdominal: She exhibits no distension.       Tender at lower abd. B, no mass or rebound   Musculoskeletal: She exhibits no edema.       Palpation of the right flank normal, no rash.          Assessment & Plan:

## 2010-09-21 NOTE — Assessment & Plan Note (Addendum)
Complaining of suprapubic pain for the last 4 months. Today there is clear evidence of a UTI. We'll treat with antibiotics but at the same time we'll do a pelvic ultrasound to be sure her ovaries are normal

## 2010-09-21 NOTE — Assessment & Plan Note (Addendum)
Due for any physical exams 02/2011. Wonders if she needs to have a colonoscopy call if she does, recommend to call GI

## 2010-09-22 ENCOUNTER — Other Ambulatory Visit (INDEPENDENT_AMBULATORY_CARE_PROVIDER_SITE_OTHER): Payer: Medicare Other

## 2010-09-22 ENCOUNTER — Telehealth: Payer: Self-pay | Admitting: *Deleted

## 2010-09-22 ENCOUNTER — Other Ambulatory Visit: Payer: Self-pay | Admitting: Internal Medicine

## 2010-09-22 DIAGNOSIS — Z0289 Encounter for other administrative examinations: Secondary | ICD-10-CM

## 2010-09-22 DIAGNOSIS — R109 Unspecified abdominal pain: Secondary | ICD-10-CM

## 2010-09-22 DIAGNOSIS — D510 Vitamin B12 deficiency anemia due to intrinsic factor deficiency: Secondary | ICD-10-CM

## 2010-09-22 LAB — CBC WITH DIFFERENTIAL/PLATELET
Basophils Absolute: 0 10*3/uL (ref 0.0–0.1)
HCT: 40.1 % (ref 36.0–46.0)
Lymphs Abs: 2 10*3/uL (ref 0.7–4.0)
MCV: 87.2 fl (ref 78.0–100.0)
Monocytes Absolute: 0.6 10*3/uL (ref 0.1–1.0)
Platelets: 156 10*3/uL (ref 150.0–400.0)
RDW: 15.9 % — ABNORMAL HIGH (ref 11.5–14.6)

## 2010-09-22 LAB — VITAMIN B12: Vitamin B-12: 764 pg/mL (ref 211–911)

## 2010-09-22 NOTE — Telephone Encounter (Signed)
Message copied by Leanne Lovely on Fri Sep 22, 2010 10:28 AM ------      Message from: Duaine Dredge      Created: Fri Sep 22, 2010  8:02 AM       Dr. Drue Novel is ordering an US Pelvis on this pt.  Will you please add a Transvaginal US also, required by radiology.            Thank you

## 2010-09-26 ENCOUNTER — Telehealth: Payer: Self-pay | Admitting: *Deleted

## 2010-09-26 ENCOUNTER — Ambulatory Visit
Admission: RE | Admit: 2010-09-26 | Discharge: 2010-09-26 | Disposition: A | Discharge status: Home / Self Care | Payer: Medicare Other | Source: Ambulatory Visit | Attending: Internal Medicine | Admitting: Internal Medicine

## 2010-09-26 DIAGNOSIS — R109 Unspecified abdominal pain: Secondary | ICD-10-CM

## 2010-09-26 NOTE — Telephone Encounter (Signed)
Message copied by Leanne Lovely on Tue Sep 26, 2010  2:29 PM ------      Message from: Willow Ora E      Created: Mon Sep 25, 2010  6:25 PM       Advise  patient      Urine culture eventually came back negative. Discontinue Cipro after 3 days.      Please be sure the ultrasound of the pelvis I ordered is  schedule.

## 2010-09-26 NOTE — Telephone Encounter (Signed)
Message left for patient to return my call.  

## 2010-09-27 NOTE — Telephone Encounter (Signed)
Pt is aware.  

## 2010-09-28 ENCOUNTER — Telehealth: Payer: Self-pay | Admitting: *Deleted

## 2010-09-28 NOTE — Telephone Encounter (Signed)
Message left for patient to return my call.  

## 2010-09-28 NOTE — Telephone Encounter (Signed)
Message copied by Leanne Lovely on Thu Sep 28, 2010  3:08 PM ------      Message from: Willow Ora E      Created: Thu Sep 28, 2010  1:12 PM       Advise patient: Ultrasound negative. Good results

## 2010-09-29 ENCOUNTER — Other Ambulatory Visit: Payer: Self-pay | Admitting: Internal Medicine

## 2010-09-29 NOTE — Telephone Encounter (Signed)
Message left for patient to return my call. Tried cell number-- non working.

## 2010-10-02 NOTE — Telephone Encounter (Signed)
Pt is aware.  

## 2010-10-11 ENCOUNTER — Other Ambulatory Visit: Payer: Self-pay | Admitting: Internal Medicine

## 2010-10-25 ENCOUNTER — Other Ambulatory Visit: Payer: Self-pay | Admitting: Internal Medicine

## 2010-10-25 MED ORDER — FELODIPINE ER 5 MG PO TB24: 5.0000 mg | ORAL_TABLET | Freq: Every day | ORAL | Status: DC

## 2010-10-25 NOTE — Telephone Encounter (Signed)
RX Done

## 2010-10-30 ENCOUNTER — Other Ambulatory Visit: Payer: Self-pay | Admitting: Internal Medicine

## 2010-11-07 ENCOUNTER — Other Ambulatory Visit: Payer: Self-pay | Admitting: Internal Medicine

## 2010-11-07 NOTE — Telephone Encounter (Signed)
Rx Done . 

## 2010-11-08 ENCOUNTER — Other Ambulatory Visit: Payer: Self-pay | Admitting: Internal Medicine

## 2010-11-14 ENCOUNTER — Other Ambulatory Visit: Payer: Self-pay | Admitting: Internal Medicine

## 2010-11-14 ENCOUNTER — Encounter: Payer: Self-pay | Admitting: Gastroenterology

## 2010-11-16 ENCOUNTER — Other Ambulatory Visit: Payer: Self-pay | Admitting: Cardiology

## 2010-11-16 DIAGNOSIS — I6529 Occlusion and stenosis of unspecified carotid artery: Secondary | ICD-10-CM

## 2010-11-17 ENCOUNTER — Encounter: Payer: Self-pay | Admitting: Cardiology

## 2010-11-17 ENCOUNTER — Ambulatory Visit (INDEPENDENT_AMBULATORY_CARE_PROVIDER_SITE_OTHER): Payer: Medicare Other | Admitting: Cardiology

## 2010-11-17 ENCOUNTER — Encounter (INDEPENDENT_AMBULATORY_CARE_PROVIDER_SITE_OTHER): Payer: Medicare Other | Admitting: *Deleted

## 2010-11-17 VITALS — BP 127/57 | HR 58 | Resp 14 | Ht 64.0 in | Wt 156.0 lb

## 2010-11-17 DIAGNOSIS — Z9889 Other specified postprocedural states: Secondary | ICD-10-CM

## 2010-11-17 DIAGNOSIS — Z8679 Personal history of other diseases of the circulatory system: Secondary | ICD-10-CM

## 2010-11-17 DIAGNOSIS — I10 Essential (primary) hypertension: Secondary | ICD-10-CM

## 2010-11-17 DIAGNOSIS — I6529 Occlusion and stenosis of unspecified carotid artery: Secondary | ICD-10-CM

## 2010-11-17 DIAGNOSIS — E785 Hyperlipidemia, unspecified: Secondary | ICD-10-CM

## 2010-11-17 NOTE — Progress Notes (Signed)
HPI Tammy [name] returns today for evaluation and management of her carotid artery disease. She also has hypertension and hyperlipidemia.  She denies any symptoms of many strokes or TIAs. Pulmonary Doppler is today show her right carotid endarterectomy be patent, 40-59% right internal carotid artery stenosis, 0-39% in the left internal carotid artery, and antegrade flow in the vertebrals. This is relatively stable.  She's having no symptoms of angina or ischemic heart disease. There's no symptoms of claudication or peripheral vascular disease.  She is compliant with her medications. Labs being followed by primary care.  Her EKG is remarkable for sinus bradycardia otherwise normal. Past Medical History  Diagnosis Date  . CAD (coronary artery disease)     stress test 2003 "fix defect"  . Cerebrovascular accident 2000    Carotid endarterectomy,right-- due to trauma  . Hyperlipidemia   . Hypertension   . Allergic rhinitis     skin test 04/20/08  . Pneumonia     several pneumonias as a child, and otitis  . Salivary gland infection     s/p removal Dr Ezzard Standing  . Hemorrhoid   . Psoriasis 2010  . Osteopenia 4/10    DEXA,started fosamx  . Shingles 02/2010  . ANEMIA, PERNICIOUS 07/31/2006    Past Surgical History  Procedure Date  . Cholecystectomy   . Tubal ligation   . Abdominal hysterectomy     no oophorectomy  . Salivary gland surgery     & LN resection 2002, Dr.Newman  . Carotid endarterectomy     right, 2000 due to trauma   . Appendectomy   . Spine surgery 12/10    extensive, decompression,allograft placement  . Shoulder surgery 11/2009    right, rotator cuff repiar, biciptial tendin repiar (Dr Debby Bud)    Family History  Problem Relation Age of Onset  . Hypertension Mother   . Hyperlipidemia Mother   . Coronary artery disease Father   . Coronary artery disease Brother 24  . Parkinsonism Father   . Breast cancer Neg Hx   . Colon cancer Neg Hx     History   Social  History  . Marital Status: Divorced    Spouse Name: N/A    Number of Children: 3  . Years of Education: N/A   Occupational History  . [employer] Greeter-stands in doorway    Social History Main Topics  . Smoking status: Former Games developer  . Smokeless tobacco: Not on file  . Alcohol Use: No  . Drug Use: Not on file  . Sexually Active: Not on file   Other Topics Concern  . Not on file   Social History Narrative  . No narrative on file    Allergies  Allergen Reactions  . Amoxicillin     REACTION: sick on stomach  . Clarithromycin     REACTION: sick on stomach  . Codeine     REACTION: nausea  . Cyclobenzaprine Hcl     REACTION: pass out  . Hydrocodone     REACTION: nausea  . Morphine Sulfate     REACTION: "stop my heart"  . Tramadol Hcl     REACTION: nausea    Current Outpatient Prescriptions  Medication Sig Dispense Refill  . alendronate (FOSAMAX) 70 MG tablet TAKE 1 TABLET BY MOUTH EVERY WEEK  4 tablet  0  . aspirin 81 MG tablet Take 81 mg by mouth daily.        . clobetasol (TEMOVATE) 0.05 % cream Apply topically 2 (two) times daily.  30 g  0  . clonazePAM (KLONOPIN) 1 MG tablet Take 1 tablet by mouth twice a day and 2 tabs at bedtime as needed for anxiety/insomnia.  90 tablet  1  . CRESTOR 40 MG tablet TAKE 1 TABLET AT BEDTIME  30 tablet  4  . cyanocobalamin (,VITAMIN B-12,) 1000 MCG/ML injection INJECT 1000 MICROGRAMS INTRAMUSCULARLY EVERY MONTH  10 mL  0  . DIOVAN 160 MG tablet TAKE 1 TABLET BY MOUTH EVERY DAY  30 tablet  5  . felodipine (PLENDIL) 5 MG 24 hr tablet Take 1 tablet (5 mg total) by mouth daily.  30 tablet  3  . fluticasone (FLONASE) 50 MCG/ACT nasal spray Place 2 sprays into the nose daily.        . hydrochlorothiazide 25 MG tablet Take 25 mg by mouth daily.         ROS Negative other than HPI.   PE General Appearance: well developed, well nourished in no acute distress HEENT: symmetrical face, PERRLA, good dentition  Neck: no JVD, thyromegaly, or  adenopathy, trachea midline Chest: symmetric without deformity Cardiac: PMI non-displaced, RRR, normal S1, S2, no gallop or murmur Lung: clear to ausculation and percussion Vascular: all pulses full without bruits  Abdominal: nondistended, nontender, good bowel sounds, no HSM, no bruits Extremities: no cyanosis, clubbing or edema, no sign of DVT, no varicosities  Skin: normal color, no rashes Neuro: alert and oriented x 3, non-focal Pysch: normal affect Filed Vitals:   11/17/10 1010  BP: 127/57  Pulse: 58  Resp: 14  Height: 5\' 4"  (1.626 m)  Weight: 156 lb (70.761 kg)    EKG  Labs and Studies Reviewed.   Lab Results  Component Value Date   WBC 6.9 09/22/2010   HGB 13.2 09/22/2010   HCT 40.1 09/22/2010   MCV 87.2 09/22/2010   PLT 156.0 09/22/2010      Chemistry      Component Value Date/Time   NA 140 05/22/2010 0935   K 4.4 05/22/2010 0935   CL 102 05/22/2010 0935   CO2 27 05/22/2010 0935   BUN 17 05/22/2010 0935   CREATININE 1.3* 05/22/2010 0935      Component Value Date/Time   CALCIUM 9.7 05/22/2010 0935   ALKPHOS 89 05/15/2008 2351   AST 25 02/20/2010 0833   ALT 17 02/20/2010 0833   BILITOT 0.9 05/15/2008 2351       Lab Results  Component Value Date   CHOL 162 02/20/2010   CHOL 150 11/18/2008   CHOL 149 01/01/2008   Lab Results  Component Value Date   HDL 69.90 02/20/2010   HDL 66.80 11/18/2008   HDL 16.9 01/01/2008   Lab Results  Component Value Date   LDLCALC 77 02/20/2010   LDLCALC 67 11/18/2008   LDLCALC 64 01/01/2008   Lab Results  Component Value Date   TRIG 77.0 02/20/2010   TRIG 79.0 11/18/2008   TRIG 80 01/01/2008   Lab Results  Component Value Date   CHOLHDL 2 02/20/2010   CHOLHDL 2 11/18/2008   CHOLHDL 2.2 CALC 01/01/2008   No results found for this basename: HGBA1C   Lab Results  Component Value Date   ALT 17 02/20/2010   AST 25 02/20/2010   ALKPHOS 89 05/15/2008   BILITOT 0.9 05/15/2008   Lab Results  Component Value Date   TSH  3.36 06/17/2008

## 2010-11-17 NOTE — Assessment & Plan Note (Signed)
Carotid Doppler stable today. Continue secondary preventive therapy. Emphasized with patient. Repeat Dopplers in a year.

## 2010-11-17 NOTE — Patient Instructions (Signed)
Your physician recommends that you schedule a follow-up appointment in: 1 year with Dr. Wall  

## 2010-11-21 ENCOUNTER — Ambulatory Visit (AMBULATORY_SURGERY_CENTER): Payer: Medicare Other

## 2010-11-21 VITALS — Ht 64.5 in | Wt 157.7 lb

## 2010-11-21 DIAGNOSIS — Z8371 Family history of colonic polyps: Secondary | ICD-10-CM

## 2010-11-21 MED ORDER — PEG-KCL-NACL-NASULF-NA ASC-C 100 G PO SOLR: 1.0000 | Freq: Once | ORAL | Status: AC

## 2010-12-04 ENCOUNTER — Encounter: Payer: Self-pay | Admitting: Gastroenterology

## 2010-12-04 ENCOUNTER — Ambulatory Visit (AMBULATORY_SURGERY_CENTER): Payer: Medicare Other | Admitting: Gastroenterology

## 2010-12-04 VITALS — BP 135/91 | HR 59 | Temp 96.8°F | Resp 22 | Ht 64.5 in | Wt 157.0 lb

## 2010-12-04 DIAGNOSIS — D126 Benign neoplasm of colon, unspecified: Secondary | ICD-10-CM

## 2010-12-04 DIAGNOSIS — K5730 Diverticulosis of large intestine without perforation or abscess without bleeding: Secondary | ICD-10-CM

## 2010-12-04 DIAGNOSIS — Z8371 Family history of colonic polyps: Secondary | ICD-10-CM

## 2010-12-04 DIAGNOSIS — Z1211 Encounter for screening for malignant neoplasm of colon: Secondary | ICD-10-CM

## 2010-12-04 MED ORDER — SODIUM CHLORIDE 0.9 % IV SOLN: 500.0000 mL | INTRAVENOUS | Status: DC

## 2010-12-04 NOTE — Patient Instructions (Signed)
Diverticulosis Diverticulosis is a common condition that develops when small pouches (diverticula) form in the wall of the colon. The risk of diverticulosis increases with age. It happens more often in people who eat a low-fiber diet. Most individuals with diverticulosis have no symptoms. Those individuals with symptoms usually experience belly (abdominal) pain, constipation, or loose stools (diarrhea). HOME CARE INSTRUCTIONS  Increase the amount of fiber in your diet as directed by your caregiver or dietician. This may reduce symptoms of diverticulosis.   Your caregiver may recommend taking a dietary fiber supplement.   Drink at least 6 to 8 glasses of water each day to prevent constipation.   Try not to strain when you have a bowel movement.   Your caregiver may recommend avoiding nuts and seeds to prevent complications, although this is still an uncertain benefit.   Only take over-the-counter or prescription medicines for pain, discomfort, or fever as directed by your caregiver.  FOODS HAVING HIGH FIBER CONTENT INCLUDE:  Fruits. Apple, peach, pear, tangerine, raisins, prunes.   Vegetables. Brussels sprouts, asparagus, broccoli, cabbage, carrot, cauliflower, romaine lettuce, spinach, summer squash, tomato, winter squash, zucchini.   Starchy Vegetables. Baked beans, kidney beans, lima beans, split peas, lentils, potatoes (with skin).   Grains. Whole wheat bread, brown rice, bran flake cereal, plain oatmeal, white rice, shredded wheat, bran muffins.  SEEK IMMEDIATE MEDICAL CARE IF:  You develop increasing pain or severe bloating.  You have an oral temperature above 100  Polyps, Colon  A polyp is extra tissue that grows inside your body. Colon polyps grow in the large intestine. The large intestine, also called the colon, is part of your digestive system. It is a long, hollow tube at the end of your digestive tract where your body makes and stores stool. Most polyps are not dangerous.  They are benign. This means they are not cancerous. But over time, some types of polyps can turn into cancer. Polyps that are smaller than a pea are usually not harmful. But larger polyps could someday become or may already be cancerous. To be safe, doctors remove all polyps and test them.  WHO GETS POLYPS? Anyone can get polyps, but certain people are more likely than others. You may have a greater chance of getting polyps if: You are over 50.  You have had polyps before.  Someone in your family has had polyps.  Someone in your family has had cancer of the large intestine.  Find out if someone in your family has had polyps. You may also be more likely to get polyps if you:  Eat a lot of fatty foods  Smoke  Drink alcohol  Do not exercise Eat too much  SYMPTOMS Most small polyps do not cause symptoms. People often do not know they have one until their caregiver finds it during a regular checkup or while testing them for something else. Some people do have symptoms like these: Bleeding from the anus. You might notice blood on your underwear or on toilet paper after you have had a bowel movement.  Constipation or diarrhea that lasts more than a week.  Blood in the stool. Blood can make stool look black or it can show up as red streaks in the stool.  If you have any of these symptoms, see your caregiver. HOW DOES THE DOCTOR TEST FOR POLYPS? The doctor can use four tests to check for polyps: Digital rectal exam. The caregiver wears gloves and checks your rectum (the last part of the large intestine)  to see if it feels normal. This test would find polyps only in the rectum. Your caregiver may need to do one of the other tests listed below to find polyps higher up in the intestine.  Barium enema. The caregiver puts a liquid called barium into your rectum before taking x-rays of your large intestine. Barium makes your intestine look white in the pictures. Polyps are dark, so they are easy to see.    Sigmoidoscopy. With this test, the caregiver can see inside your large intestine. A thin flexible tube is placed into your rectum. The device is called a sigmoidoscope, which has a light and a tiny video camera in it. The caregiver uses the sigmoidoscope to look at the last third of your large intestine.  Colonoscopy. This test is like sigmoidoscopy, but the caregiver looks at all of the large intestine. It usually requires sedation. This is the most common method for finding and removing polyps.  TREATMENT The caregiver will remove the polyp during sigmoidoscopy or colonoscopy. The polyp is then tested for cancer.  If you have had polyps, your caregiver may want you to get tested regularly in the future.  PREVENTION There is not one sure way to prevent polyps. You might be able to lower your risk of getting them if you: Eat more fruits and vegetables and less fatty food.  Do not smoke.  Avoid alcohol.  Exercise every day.  Lose weight if you are overweight.  Eating more calcium and folate can also lower your risk of getting polyps. Some foods that are rich in calcium are milk, cheese, and broccoli. Some foods that are rich in folate are chickpeas, kidney beans, and spinach.  Aspirin might help prevent polyps. Studies are under way.  Document Released: 11/16/2003 Document Re-Released: 08/09/2009  [facility] Patient Information 2011 [place], [place]., not controlled by medicine.   You develop vomiting or bowel movements that are bloody or black.  Document Released: 11/17/2003 Document Re-Released: 08/09/2009 [facility] Patient Information 2011 [place], [place].

## 2010-12-05 ENCOUNTER — Telehealth: Payer: Self-pay | Admitting: *Deleted

## 2010-12-05 NOTE — Telephone Encounter (Signed)

## 2010-12-05 NOTE — Telephone Encounter (Signed)
Discuss with patient  

## 2010-12-05 NOTE — Telephone Encounter (Signed)
Left message to call office

## 2010-12-05 NOTE — Telephone Encounter (Signed)
I am not sure if there is a generic for Diovan (which would be valsartan); there is losartan but they are not the same. Recommend to stay on diovan until she comes back

## 2010-12-05 NOTE — Telephone Encounter (Signed)
Pt states that pharmacy informed her that there is a generic to Diovan. Pt would like to change to this med. .Please advise

## 2010-12-08 ENCOUNTER — Ambulatory Visit (INDEPENDENT_AMBULATORY_CARE_PROVIDER_SITE_OTHER): Payer: Medicare Other | Admitting: Internal Medicine

## 2010-12-08 ENCOUNTER — Encounter: Payer: Self-pay | Admitting: Internal Medicine

## 2010-12-08 DIAGNOSIS — J309 Allergic rhinitis, unspecified: Secondary | ICD-10-CM

## 2010-12-08 DIAGNOSIS — I10 Essential (primary) hypertension: Secondary | ICD-10-CM

## 2010-12-08 MED ORDER — VALSARTAN 160 MG PO TABS: 160.0000 mg | ORAL_TABLET | Freq: Every day | ORAL | Status: DC

## 2010-12-08 MED ORDER — FLUTICASONE PROPIONATE 50 MCG/ACT NA SUSP: 2.0000 | Freq: Every day | NASAL | Status: DC

## 2010-12-08 NOTE — Assessment & Plan Note (Signed)
Refill Flonase. 

## 2010-12-08 NOTE — Assessment & Plan Note (Addendum)
Patient's  pharmacist told her a generic Diovan is available, a new prescription is issued. As long as the medication is issued by her local pharmacist I am okay (rec against "internet " pharmacies). She is taking half hydrochlorothiazide, BP remains well-controlled.

## 2010-12-08 NOTE — Progress Notes (Signed)
  Subjective:    Patient ID: Tammy [name], female    DOB: [birth year], 76 y.o.   MRN: [id number]  HPI Hypertension, likes to change to generic Diovan. She is taking half hydrochlorothiazide, not ambulatory BPs. Also complaining of pain in her right heel, very intense when she walks.  Past Medical History  Diagnosis Date  . CAD (coronary artery disease)     stress test 2003 "fix defect"  . Cerebrovascular accident 2000    Carotid endarterectomy,right-- due to trauma  . Hyperlipidemia   . Hypertension   . Allergic rhinitis     skin test 04/20/08  . Pneumonia     several pneumonias as a child, and otitis  . Salivary gland infection     s/p removal Dr Ezzard Standing  . Hemorrhoid   . Psoriasis 2010  . Osteopenia 4/10    DEXA,started fosamx  . Shingles 02/2010  . ANEMIA, PERNICIOUS 07/31/2006   Past Surgical History  Procedure Date  . Cholecystectomy   . Tubal ligation   . Abdominal hysterectomy     no oophorectomy  . Salivary gland surgery     & LN resection 2002, Dr.Newman  . Carotid endarterectomy     right, 2000 due to trauma   . Appendectomy   . Spine surgery 12/10    extensive, decompression,allograft placement  . Shoulder surgery 11/2009    right, rotator cuff repiar, biciptial tendin repiar (Dr Debby Bud)     Review of Systems No foot injury, swelling or pain. Also of chart is reviewed, she recently had a colonoscopy a few days ago, next in 5 years She also saw cardiology last month in reference to carotid artery disease, she is a stable, followup in 1 year. Allergy symptoms well controlled, needs a refill of her nasal sprays.    Objective:   Physical Exam  Constitutional: She appears well-developed and well-nourished.  HENT:  Head: Normocephalic and atraumatic.  Cardiovascular: Normal rate, regular rhythm and normal heart sounds.   Pulmonary/Chest: Effort normal and breath sounds normal. No respiratory distress. She has no wheezes.  Musculoskeletal: She exhibits  no edema.       calves symmetric, feet normal to inspection and palpation except for pain at the bottom of the right heel          Assessment & Plan:  Plantar fasciitis: Recommend stretching and ice the area at night. If not better in 2 weeks , let me know

## 2010-12-08 NOTE — Patient Instructions (Addendum)
Stretching twice a day! Physical exam in December ( after 02-21-11)

## 2010-12-12 LAB — URINALYSIS, ROUTINE W REFLEX MICROSCOPIC
Nitrite: NEGATIVE
Specific Gravity, Urine: 1.038 — ABNORMAL HIGH
Urobilinogen, UA: 0.2

## 2010-12-12 LAB — CBC
HCT: 44.3
Hemoglobin: 14.9
MCHC: 33.6
MCV: 87.1
RDW: 14.5 — ABNORMAL HIGH

## 2010-12-12 LAB — DIFFERENTIAL
Basophils Relative: 0
Eosinophils Absolute: 0.1
Eosinophils Relative: 2
Lymphs Abs: 0.9
Monocytes Relative: 2 — ABNORMAL LOW

## 2010-12-12 LAB — COMPREHENSIVE METABOLIC PANEL
ALT: 28
AST: 35
Alkaline Phosphatase: 81
Calcium: 9.5
Chloride: 106
Glucose, Bld: 96
Total Protein: 7.8

## 2011-01-01 ENCOUNTER — Other Ambulatory Visit: Payer: Self-pay | Admitting: Internal Medicine

## 2011-01-01 MED ORDER — VALSARTAN 160 MG PO TABS: 160.0000 mg | ORAL_TABLET | Freq: Every day | ORAL | Status: DC

## 2011-01-01 MED ORDER — HYDROCHLOROTHIAZIDE 25 MG PO TABS: 12.5000 mg | ORAL_TABLET | Freq: Every day | ORAL | Status: DC

## 2011-01-01 NOTE — Telephone Encounter (Signed)
Done

## 2011-01-05 ENCOUNTER — Other Ambulatory Visit: Payer: Self-pay | Admitting: Internal Medicine

## 2011-01-31 ENCOUNTER — Other Ambulatory Visit: Payer: Self-pay | Admitting: Internal Medicine

## 2011-02-23 ENCOUNTER — Ambulatory Visit (INDEPENDENT_AMBULATORY_CARE_PROVIDER_SITE_OTHER): Payer: Medicare Other | Admitting: Internal Medicine

## 2011-02-23 ENCOUNTER — Encounter: Payer: Self-pay | Admitting: Internal Medicine

## 2011-02-23 DIAGNOSIS — I6529 Occlusion and stenosis of unspecified carotid artery: Secondary | ICD-10-CM

## 2011-02-23 DIAGNOSIS — I10 Essential (primary) hypertension: Secondary | ICD-10-CM

## 2011-02-23 DIAGNOSIS — M899 Disorder of bone, unspecified: Secondary | ICD-10-CM

## 2011-02-23 DIAGNOSIS — Z0000 Encounter for general adult medical examination without abnormal findings: Secondary | ICD-10-CM

## 2011-02-23 DIAGNOSIS — E785 Hyperlipidemia, unspecified: Secondary | ICD-10-CM

## 2011-02-23 DIAGNOSIS — F411 Generalized anxiety disorder: Secondary | ICD-10-CM

## 2011-02-23 DIAGNOSIS — D510 Vitamin B12 deficiency anemia due to intrinsic factor deficiency: Secondary | ICD-10-CM

## 2011-02-23 LAB — BASIC METABOLIC PANEL
BUN: 24 mg/dL — ABNORMAL HIGH (ref 6–23)
CO2: 30 mEq/L (ref 19–32)
Calcium: 9.6 mg/dL (ref 8.4–10.5)
Creatinine, Ser: 1.3 mg/dL — ABNORMAL HIGH (ref 0.4–1.2)
Glucose, Bld: 94 mg/dL (ref 70–99)

## 2011-02-23 LAB — AST: AST: 24 U/L (ref 0–37)

## 2011-02-23 LAB — ALT: ALT: 19 U/L (ref 0–35)

## 2011-02-23 LAB — TSH: TSH: 2.15 u[IU]/mL (ref 0.35–5.50)

## 2011-02-23 LAB — LIPID PANEL
HDL: 79.6 mg/dL (ref >39.00)
Total CHOL/HDL Ratio: 2

## 2011-02-23 MED ORDER — LOSARTAN POTASSIUM 100 MG PO TABS: 100.0000 mg | ORAL_TABLET | Freq: Every day | ORAL | Status: DC

## 2011-02-23 MED ORDER — AMLODIPINE BESYLATE 10 MG PO TABS: 10.0000 mg | ORAL_TABLET | Freq: Every day | ORAL | Status: DC

## 2011-02-23 NOTE — Assessment & Plan Note (Signed)
Labs and samples of Crestor provided

## 2011-02-23 NOTE — Assessment & Plan Note (Signed)
Controlled with Klonopin when necessary

## 2011-02-23 NOTE — Assessment & Plan Note (Addendum)
Td 2003 flu shot 01-2011 pneumonia shot 2006 shingles shot -- never had one, dx w/ shingles 2011 hysterectmy years ago (d/t bleeding) , no oophorectomy, no h/o abnormal PAPs . Last PAP [phone] normal-----> no further Paps last MMG 10-2009 , scheduling a mammogram today breast exam today normal  Cscope 2002 ----> repeat colonoscopy 12-2010,  6 mm polyp, next colonoscopy per GI   diet exercise discussed

## 2011-02-23 NOTE — Assessment & Plan Note (Signed)
Will ask lab to add a b12 level

## 2011-02-23 NOTE — Assessment & Plan Note (Signed)
Last bone density test 06-2008 show osteopenia, vitamin D at the time was normal. On Fosamax for prevention Plan: Bone density test, continue with Fosamax for now

## 2011-02-23 NOTE — Patient Instructions (Signed)
Stop Diovan and felodipine Start losartan and amlodipine Check the  blood pressure 2 or 3 times a week, be sure it is less than 140/85. If it is consistently higher, let me know

## 2011-02-23 NOTE — Assessment & Plan Note (Signed)
Last carotid ultrasound 11-2010, stable, controlling cardiovascular risk factors

## 2011-02-23 NOTE — Assessment & Plan Note (Signed)
Cost is a major issue Discontinue Diovan, start losartan Has not been taking  Felodipine, switch to amlodipine

## 2011-02-23 NOTE — Progress Notes (Signed)
  Subjective:    Patient ID: Tammy [name], female    DOB: 13-Oct-1934, 76 y.o.   MRN: [id number]  HPI  Here for Medicare AWV: 1. Risk factors based on Past M, S, F history: reviewed  2. Physical Activities: walks a lot at work ([employer]) 3. Depression/mood: denies problems, good spirits .  4. Hearing: mild B tinnitus x years, no problems hearing  5. ADL's: totally independent 6. Fall Risk: low risk, no h/o falls  7. Home Safety: does feel safe at home 8. Height, weight, &visual acuity: see VS, wears contacts, doing well  9. Counseling: yes, see below 10. Labs ordered based on risk factors: yes 11.           Referral Coordination, if needed 12.           Care Plan-- see a/p 13.            Cognitive Assessment, motor skills, memory and balance seems stable   in addition, we discussed the following issues: Hyperlipidemia-- good medication compliance  Hypertension, cost is a major issue, Diovan is quite expensive, she had to stop taking felodipine  osteopenia, good compliance with Fosamax and  w/ precautions     Past Medical History: Coronary artery disease, stress test 2004 "fix defect"    Cerebrovascular accident 2000,   CAROTID ENDARTERECTOMY, RIGHT,  -- due to trauma    Hyperlipidemia    Hypertension    Allergic rhinitis- Skin test 04/20/08    Recurrent rhinosinusitis    Several pneumonias and otitis  as a child.    Salivary gland infection, s/p removal Dr Ezzard Standing Hemorrhoid h/o pernicious anemia Hemorrhoid psoriasis (2010) osteopenia,  DEXA 4-10 , started Fosamax    Past Surgical History: Cholecystectomy tubal ligation Hysterectomy, no oophorectomy Salivary gland & LN resection 2002 ,Dr Ezzard Standing CAROTID ENDARTERECTOMY, RIGHT 2,000 -- due to trauma Appendectomy 12-10 extensive cervical spine surgery, decompression, allograft placement 11-2009 R shoulder surgery, rotator cuff repair, bicipital tendin repair (Dr Debby Bud)  Family History: Hypertension--  M hyperlipidema--M CAD--F , brother CABG age 52 parkinson--F breast ca--no colon ca--no  Social History: Single/ divorced/ lives alone, 3 children Former Smoker-1994 Alcohol use- very rarely Works at [employer] Diet: does watch  Review of Systems No chest pain or shortness or breath No nausea, vomiting, diarrhea or blood in the stools No dysuria or gross hematuria    Objective:   Physical Exam  Constitutional: She is oriented to person, place, and time. She appears well-developed and well-nourished. No distress.  HENT:  Head: Normocephalic and atraumatic.  Neck: No thyromegaly present.       Good carotid pulses bilaterally, very mild bruit on the right side  Cardiovascular: Normal rate, regular rhythm and normal heart sounds.   No murmur heard. Pulmonary/Chest: Effort normal and breath sounds normal. No respiratory distress. She has no wheezes. She has no rales.  Abdominal: Soft. Bowel sounds are normal. She exhibits no distension. There is no tenderness. There is no rebound and no guarding.  Genitourinary:       Breast exam bilaterally within normal, no axillary lymphadenopathy   Musculoskeletal: She exhibits no edema.  Neurological: She is alert and oriented to person, place, and time.  Skin: Skin is warm and dry. She is not diaphoretic.  Psychiatric: She has a normal mood and affect. Her behavior is normal. Judgment and thought content normal.       Assessment & Plan:

## 2011-02-26 LAB — VITAMIN B12: Vitamin B-12: 495 pg/mL (ref 211–911)

## 2011-02-28 ENCOUNTER — Telehealth: Payer: Self-pay | Admitting: *Deleted

## 2011-02-28 NOTE — Telephone Encounter (Signed)
Message copied by Verdene Rio on Wed Feb 28, 2011 11:03 AM ------      Message from: Willow Ora E      Created: Fri Feb 23, 2011  5:44 PM       Please ask lab to run a B12, dx pernicious anemia

## 2011-02-28 NOTE — Telephone Encounter (Signed)
Thank you :)

## 2011-02-28 NOTE — Telephone Encounter (Signed)
Lab add on faxed Please see labs.

## 2011-03-04 ENCOUNTER — Other Ambulatory Visit: Payer: Self-pay | Admitting: Internal Medicine

## 2011-03-12 ENCOUNTER — Other Ambulatory Visit: Payer: Self-pay | Admitting: *Deleted

## 2011-03-12 MED ORDER — CLOBETASOL PROPIONATE 0.05 % EX CREA: TOPICAL_CREAM | Freq: Two times a day (BID) | CUTANEOUS | Status: DC

## 2011-03-12 NOTE — Telephone Encounter (Signed)
Rx sent 

## 2011-03-13 ENCOUNTER — Other Ambulatory Visit: Payer: Self-pay | Admitting: Internal Medicine

## 2011-03-13 DIAGNOSIS — E875 Hyperkalemia: Secondary | ICD-10-CM

## 2011-03-14 ENCOUNTER — Other Ambulatory Visit (INDEPENDENT_AMBULATORY_CARE_PROVIDER_SITE_OTHER): Payer: Medicare Other

## 2011-03-14 ENCOUNTER — Telehealth: Payer: Self-pay | Admitting: Internal Medicine

## 2011-03-14 DIAGNOSIS — E875 Hyperkalemia: Secondary | ICD-10-CM

## 2011-03-14 LAB — BASIC METABOLIC PANEL
CO2: 29 mEq/L (ref 19–32)
Calcium: 9.3 mg/dL (ref 8.4–10.5)
Creatinine, Ser: 1.1 mg/dL (ref 0.4–1.2)
GFR: 49.45 mL/min — ABNORMAL LOW (ref >60.00)

## 2011-03-14 NOTE — Telephone Encounter (Signed)
Pt has future order pending for bone density. Pt need to continue with med until bone density result are in. Discuss with patient who indicated that she has already had bone density done, advise Pt once result are in we can determine if med is still need, Pt ok awaiting result.

## 2011-03-16 ENCOUNTER — Telehealth: Payer: Self-pay | Admitting: Internal Medicine

## 2011-03-16 NOTE — Telephone Encounter (Signed)
Bone density test show osteopenia, also there is improvement at the right hip bone mineral density. Plan: Okay to discontinue Fosamax, continue with physical activity, calcium, vitamin D. Next density test in 3 years

## 2011-03-20 ENCOUNTER — Encounter: Payer: Self-pay | Admitting: Internal Medicine

## 2011-03-20 NOTE — Telephone Encounter (Signed)
Left message to call office see lab result: Advise patient: K+ and renal Fx normal

## 2011-03-21 NOTE — Telephone Encounter (Signed)
Discuss with patient  

## 2011-03-28 ENCOUNTER — Encounter: Payer: Self-pay | Admitting: Internal Medicine

## 2011-04-08 ENCOUNTER — Other Ambulatory Visit: Payer: Self-pay | Admitting: Internal Medicine

## 2011-04-09 NOTE — Telephone Encounter (Signed)
Refill done.  

## 2011-04-12 ENCOUNTER — Other Ambulatory Visit: Payer: Self-pay | Admitting: Internal Medicine

## 2011-04-12 NOTE — Telephone Encounter (Signed)
Refill done.  

## 2011-04-16 ENCOUNTER — Other Ambulatory Visit: Payer: Self-pay

## 2011-04-16 ENCOUNTER — Emergency Department (HOSPITAL_COMMUNITY)
Admission: EM | Admit: 2011-04-16 | Discharge: 2011-04-16 | Disposition: A | Discharge status: Home / Self Care | Payer: Medicare Other | Attending: Emergency Medicine | Admitting: Emergency Medicine

## 2011-04-16 ENCOUNTER — Emergency Department (HOSPITAL_COMMUNITY): Payer: Medicare Other

## 2011-04-16 ENCOUNTER — Telehealth: Payer: Self-pay | Admitting: Internal Medicine

## 2011-04-16 ENCOUNTER — Encounter (HOSPITAL_COMMUNITY): Payer: Self-pay | Admitting: Emergency Medicine

## 2011-04-16 DIAGNOSIS — I2510 Atherosclerotic heart disease of native coronary artery without angina pectoris: Secondary | ICD-10-CM | POA: Insufficient documentation

## 2011-04-16 DIAGNOSIS — R5383 Other fatigue: Secondary | ICD-10-CM | POA: Insufficient documentation

## 2011-04-16 DIAGNOSIS — R209 Unspecified disturbances of skin sensation: Secondary | ICD-10-CM | POA: Insufficient documentation

## 2011-04-16 DIAGNOSIS — Z8679 Personal history of other diseases of the circulatory system: Secondary | ICD-10-CM | POA: Insufficient documentation

## 2011-04-16 DIAGNOSIS — R55 Syncope and collapse: Secondary | ICD-10-CM | POA: Insufficient documentation

## 2011-04-16 DIAGNOSIS — R61 Generalized hyperhidrosis: Secondary | ICD-10-CM | POA: Insufficient documentation

## 2011-04-16 DIAGNOSIS — R110 Nausea: Secondary | ICD-10-CM | POA: Insufficient documentation

## 2011-04-16 DIAGNOSIS — R42 Dizziness and giddiness: Secondary | ICD-10-CM | POA: Insufficient documentation

## 2011-04-16 DIAGNOSIS — N289 Disorder of kidney and ureter, unspecified: Secondary | ICD-10-CM

## 2011-04-16 DIAGNOSIS — I10 Essential (primary) hypertension: Secondary | ICD-10-CM | POA: Insufficient documentation

## 2011-04-16 DIAGNOSIS — E785 Hyperlipidemia, unspecified: Secondary | ICD-10-CM | POA: Insufficient documentation

## 2011-04-16 DIAGNOSIS — R5381 Other malaise: Secondary | ICD-10-CM | POA: Insufficient documentation

## 2011-04-16 LAB — DIFFERENTIAL
Basophils Relative: 1 % (ref 0–1)
Eosinophils Absolute: 0.1 10*3/uL (ref 0.0–0.7)
Lymphs Abs: 1.9 10*3/uL (ref 0.7–4.0)
Neutro Abs: 3.8 10*3/uL (ref 1.7–7.7)
Neutrophils Relative %: 59 % (ref 43–77)

## 2011-04-16 LAB — COMPREHENSIVE METABOLIC PANEL
ALT: 15 U/L (ref 0–35)
AST: 18 U/L (ref 0–37)
CO2: 26 mEq/L (ref 19–32)
Calcium: 10.3 mg/dL (ref 8.4–10.5)
Sodium: 141 mEq/L (ref 135–145)
Total Protein: 7.6 g/dL (ref 6.0–8.3)

## 2011-04-16 LAB — TROPONIN I: Troponin I: <0.3 ng/mL (ref <0.30)

## 2011-04-16 LAB — CBC
MCH: 27.6 pg (ref 26.0–34.0)
Platelets: 158 10*3/uL (ref 150–400)
RBC: 4.71 MIL/uL (ref 3.87–5.11)

## 2011-04-16 MED ORDER — SODIUM CHLORIDE 0.9 % IV BOLUS (SEPSIS): 1000.0000 mL | Freq: Once | INTRAVENOUS | Status: DC

## 2011-04-16 NOTE — ED Notes (Addendum)
First meeting with patient. Patient states she passed out yesterday while at work and patient started eating lunch and started to feel hot and was diaphoretic and left work and went home to bed.Patient states she was LOC for about one minute. Patient denies chest pain. Patient states she has nausea and no vomiting.  Patient states she has pain in her abdomin LUQ and across the lower abdomen. Patient states she called he doctor and they said for her to come to the ED.

## 2011-04-16 NOTE — ED Provider Notes (Signed)
History     CSN: [id number]  Arrival date & time 04/16/11  1140   First MD Initiated Contact with Patient 04/16/11 1317      Chief Complaint  Patient presents with  . Loss of Consciousness    (Consider location/radiation/quality/duration/timing/severity/associated sxs/prior treatment) Patient is a 77 y.o. female presenting with syncope. The history is provided by the patient.  Loss of Consciousness  Yesterday, she got a sausage sandwich from [employer] and as she started to eat became nauseous. She then started feeling lightheaded and then became sweaty. She sat down and actually passed out for a brief period. There was associated tingling in her hands. She denies chest pain, heaviness, tightness, or pressure. She denies dyspnea. Within about 15 minutes, the nausea and diaphoresis and tingling improved. She went home, but continued to feel weak and washed out all day long. She continues to feel weak today. She's not feeling dizzy or lightheaded today. She does not recall anything else going on except for eating her sandwich. Her episode about 4 years ago. Symptoms were severe. When present, nothing made it better nothing made it worse.  Past Medical History  Diagnosis Date  . CAD (coronary artery disease)     stress test 2003 "fix defect"  . Cerebrovascular accident 2000    Carotid endarterectomy,right-- due to trauma  . Hyperlipidemia   . Hypertension   . Allergic rhinitis     skin test 04/20/08  . Pneumonia     several pneumonias as a child, and otitis  . Salivary gland infection     s/p removal Dr Ezzard Standing  . Hemorrhoid   . Psoriasis 2010  . Osteopenia 4/10    DEXA,started fosamx  . Shingles 02/2010  . ANEMIA, PERNICIOUS 07/31/2006    Past Surgical History  Procedure Date  . Cholecystectomy   . Tubal ligation   . Abdominal hysterectomy     no oophorectomy  . Salivary gland surgery     & LN resection 2002, Dr.Newman  . Carotid endarterectomy     right, 2000 due to  trauma   . Appendectomy   . Spine surgery 12/10    extensive, decompression,allograft placement  . Shoulder surgery 11/2009    right, rotator cuff repiar, biciptial tendin repiar (Dr Debby Bud)    Family History  Problem Relation Age of Onset  . Hypertension Mother   . Hyperlipidemia Mother   . Colon cancer Mother   . Coronary artery disease Father   . Parkinsonism Father   . Heart disease Father   . Coronary artery disease Brother 37  . Breast cancer Neg Hx     History  Substance Use Topics  . Smoking status: Former Smoker    Types: Cigarettes    Quit date: 11/20/1992  . Smokeless tobacco: Never Used  . Alcohol Use: 0.5 oz/week    1 drink(s) per week    OB History    Grav Para Term Preterm Abortions TAB SAB Ect Mult Living                  Review of Systems  Cardiovascular: Positive for syncope.  All other systems reviewed and are negative.    Allergies  Amoxicillin; Clarithromycin; Codeine; Cyclobenzaprine hcl; Hydrocodone; Morphine sulfate; and Tramadol hcl  Home Medications   Current Outpatient Rx  Name Route Sig Dispense Refill  . ALENDRONATE SODIUM 70 MG PO TABS Oral Take 70 mg by mouth every 7 (seven) days. Take with a full glass of water on an  empty stomach.    . AMLODIPINE BESYLATE 10 MG PO TABS Oral Take 1 tablet (10 mg total) by mouth daily. 30 tablet 6  . ASPIRIN 81 MG PO TABS Oral Take 81 mg by mouth daily.      Marland Kitchen CLOBETASOL PROPIONATE 0.05 % EX CREA Topical Apply 1 application topically 2 (two) times daily.    Marland Kitchen CLONAZEPAM 1 MG PO TABS  Take 1 tablet by mouth twice a day and 2 tabs at bedtime as needed for anxiety/insomnia. 90 tablet 1  . CYANOCOBALAMIN 1000 MCG/ML IJ SOLN Intramuscular Inject 1,000 mcg into the muscle every 30 (thirty) days.    Marland Kitchen FLUTICASONE PROPIONATE 50 MCG/ACT NA SUSP Nasal Place 2 sprays into the nose daily. 48 g 3  . HYDROCHLOROTHIAZIDE 25 MG PO TABS Oral Take 0.5 tablets (12.5 mg total) by mouth daily. 90 tablet 3  . LOSARTAN  POTASSIUM 100 MG PO TABS Oral Take 1 tablet (100 mg total) by mouth daily. 30 tablet 6  . ROSUVASTATIN CALCIUM 40 MG PO TABS Oral Take 40 mg by mouth at bedtime.      BP 172/71  Pulse 59  Temp(Src) 97.7 F (36.5 C) (Oral)  Resp 12  SpO2 99%  Physical Exam  Nursing note and vitals reviewed.  for-year-old female who is resting comfortably and in no acute distress. Vital signs are normal. Oxygen saturation is 97% which is normal. Head is normocephalic and atraumatic. PERRLA, EOMI para Chalmers Guest is clear. Neck is supple without adenopathy, JVD, or bruit. Lungs are clear without rales, wheezes, or rhonchi. Heart has regular rate and rhythm without murmur. Abdomen is soft, flat, nontender without masses or hepatosplenomegaly and peristalsis is present. Extremities no cyanosis or edema, full range of motion is present. Skin is warm and dry without rash. Neurologic: Mental status is normal, cranial nerves are intact, there no focal motor or sensory deficits.  ED Course  Procedures (including critical care time)  Results for orders placed during the hospital encounter of 04/16/11  CBC      Component Value Range   WBC 6.4  4.0 - 10.5 (K/uL)   RBC 4.71  3.87 - 5.11 (MIL/uL)   Hemoglobin 13.0  12.0 - 15.0 (g/dL)   HCT 82.9  56.2 - 13.0 (%)   MCV 88.5  78.0 - 100.0 (fL)   MCH 27.6  26.0 - 34.0 (pg)   MCHC 31.2  30.0 - 36.0 (g/dL)   RDW 86.5  78.4 - 69.6 (%)   Platelets 158  150 - 400 (K/uL)  DIFFERENTIAL      Component Value Range   Neutrophils Relative 59  43 - 77 (%)   Neutro Abs 3.8  1.7 - 7.7 (K/uL)   Lymphocytes Relative 30  12 - 46 (%)   Lymphs Abs 1.9  0.7 - 4.0 (K/uL)   Monocytes Relative 9  3 - 12 (%)   Monocytes Absolute 0.6  0.1 - 1.0 (K/uL)   Eosinophils Relative 2  0 - 5 (%)   Eosinophils Absolute 0.1  0.0 - 0.7 (K/uL)   Basophils Relative 1  0 - 1 (%)   Basophils Absolute 0.0  0.0 - 0.1 (K/uL)  TROPONIN I      Component Value Range   Troponin I <0.30  <0.30 (ng/mL)    COMPREHENSIVE METABOLIC PANEL      Component Value Range   Sodium 141  135 - 145 (mEq/L)   Potassium 4.4  3.5 - 5.1 (mEq/L)   Chloride 102  96 - 112 (mEq/L)   CO2 26  19 - 32 (mEq/L)   Glucose, Bld 130 (*) 70 - 99 (mg/dL)   BUN 32 (*) 6 - 23 (mg/dL)   Creatinine, Ser 1.61 (*) 0.50 - 1.10 (mg/dL)   Calcium 09.6  8.4 - 10.5 (mg/dL)   Total Protein 7.6  6.0 - 8.3 (g/dL)   Albumin 4.1  3.5 - 5.2 (g/dL)   AST 18  0 - 37 (U/L)   ALT 15  0 - 35 (U/L)   Alkaline Phosphatase 87  39 - 117 (U/L)   Total Bilirubin 0.4  0.3 - 1.2 (mg/dL)   GFR calc non Af Amer 35 (*) >90 (mL/min)   GFR calc Af Amer 41 (*) >90 (mL/min)   Dg Chest 2 View  04/16/2011  *RADIOLOGY REPORT*  Clinical Data: Nausea, syncope, history TIA, hypertension, carotid arterial disease  CHEST - 2 VIEW  Comparison: 08/10/2010  Findings: Normal heart size, mediastinal contours, and pulmonary vascularity. Emphysematous changes without infiltrate or effusion. Minimal linear subsegmental atelectasis versus scarring left base. Clothing artifacts project over left upper lobe. No pneumothorax. Osseous demineralization with thoracolumbar scoliosis.  IMPRESSION: Emphysematous changes with minimal atelectasis or scarring left base. No acute abnormalities.  Original Report Authenticated By: Lollie Marrow, M.D.     Date: 04/16/2011  Rate: 59  Rhythm: sinus bradycardia  QRS Axis: left  Intervals: normal  ST/T Wave abnormalities: normal  Conduction Disutrbances:none  Narrative Interpretation: Left axis deviation with poor R-wave progression. Suspect left anterior fascicular block. No old ECG available for comparison.  Old EKG Reviewed: none available  Workup was negative except for mild renal insufficiency. I had wanted to give her IV fluids, but IV was not able to be obtained. Condition is not serious enough to warrant central line or PICC line. She is is sent home with instructions to drink plenty of fluids and to have her renal function  rechecked.. Mild hyperglycemia is noted as well. This will need to be followed as an outpatient.  1. Vasovagal syncope   2. Renal insufficiency       MDM  Clinically, her episode sounds like vasovagal syncope. Atypical factors are there was no obvious precipitating cause, and her continued to feel weak and washed out following the episode. Workup will be initiated including laboratory evaluation an ECG.        Dione Booze, MD 04/16/11 [phone]

## 2011-04-16 NOTE — ED Notes (Signed)
Still waiting for IV Team. IV Team paged again.

## 2011-04-16 NOTE — ED Notes (Signed)
Passed out yesterday at 9am was at work  she states and then she went home  Feels washed out

## 2011-04-16 NOTE — Telephone Encounter (Signed)
[facility] Triage Call Report Triage Record Num: [id number] Operator: Revonda Humphrey Patient Name: Tammy [name] Call Date & Time: 04/16/2011 10:05:10AM Patient Phone: [phone] PCP: Nolon Rod. Paz Patient Gender: Female PCP Fax : Patient DOB: 04-07-1934 Practice Name: [facility] Reason for Call: Caller: Marlaine/Patient; PCP: Willow Ora; CB#: [phone]; ; ; Call regarding Passed Out Yesterday Am; When Came To Had Tingling Both Arms, Headache; Took 2 bites of [employer] sausage biscuit and coffee yesterday 2/10 about 9 am at work then felt like going to pass out. Then passed out for about 30 seconds - when came to had tingling both arms. Says this happened 5 yrs ago and told possible MI. Sat until feeling better then went home. BP 151/69 when got home (120/65 at home usual) Does not feel normal yet, feeling washed out, slight headache. Says touch diverticulitis - slight diarrhea after passed out. Unable to find appt for patient -- called office/[place] -- will speak with Dr Drue Novel then call patient. Protocol(s) Used: Fainting Recommended Outcome per Protocol: See Provider within 4 hours Reason for Outcome: Fainting episode without any warning symptoms Care Advice:

## 2011-04-16 NOTE — ED Notes (Signed)
Lisa from IV team at the patient bedside

## 2011-04-16 NOTE — ED Notes (Signed)
IV team unable to gain IV access. EDP advised.

## 2011-04-16 NOTE — ED Notes (Signed)
IV access attempted x 2 and was unable to gain access. IV Team paged.

## 2011-04-16 NOTE — Telephone Encounter (Signed)
My advise was to go to the ER

## 2011-04-16 NOTE — ED Notes (Signed)
Patient resting and waiting discharge.

## 2011-04-16 NOTE — ED Notes (Signed)
IV team called back and they will be here asap.

## 2011-04-20 ENCOUNTER — Ambulatory Visit (INDEPENDENT_AMBULATORY_CARE_PROVIDER_SITE_OTHER): Payer: Medicare Other | Admitting: Internal Medicine

## 2011-04-20 VITALS — BP 150/80 | HR 73 | Temp 97.7°F | Wt 155.0 lb

## 2011-04-20 DIAGNOSIS — R55 Syncope and collapse: Secondary | ICD-10-CM | POA: Insufficient documentation

## 2011-04-20 DIAGNOSIS — I10 Essential (primary) hypertension: Secondary | ICD-10-CM

## 2011-04-20 LAB — BASIC METABOLIC PANEL
BUN: 31 mg/dL — ABNORMAL HIGH (ref 6–23)
CO2: 26 mEq/L (ref 19–32)
Chloride: 103 mEq/L (ref 96–112)
Creat: 1.4 mg/dL — ABNORMAL HIGH (ref 0.50–1.10)

## 2011-04-20 MED ORDER — CARVEDILOL 12.5 MG PO TABS: 12.5000 mg | ORAL_TABLET | Freq: Two times a day (BID) | ORAL | Status: DC

## 2011-04-20 NOTE — Assessment & Plan Note (Addendum)
Syncope a few days ago, no apparent seizure or stroke. She has a history of carotid vascular disease, last carotid ultrasound 11/2010. Symptoms may have been related to temporary increase on hydrochlorothiazide intake. EKG from the ER at baseline Report similar sx 4 years ago Plan: Refer to cardiology, further workup?Marland Kitchen

## 2011-04-20 NOTE — Assessment & Plan Note (Addendum)
Was started on amlodipine in December 2012, 2 weeks ago noted lower extremity edema which prompted self increase in her diuretic dose. Plan: Discontinue amlodipine Go back to a normal dose of hydrochlorothiazide which is half tablet daily Coreg BMP See  instructions

## 2011-04-20 NOTE — Progress Notes (Signed)
  Subjective:    Patient ID: Tammy [name], female    DOB: 10/02/1934, 76 y.o.   MRN: [id number]  HPI Hospital followup Was seen at the ER  04/16/2011 for a syncope that happened 04-15-11, felt to be Vaso vagal. Chart reviewed Vital signs were stable, O2 sat normal, creatinine 1.4, slightly above baseline. CBC and chest x-ray negative. EKG reportedly okay.  Past Medical History:  Coronary artery disease, stress test 2004 "fix defect"  Cerebrovascular accident 2000, CAROTID ENDARTERECTOMY, RIGHT, -- due to trauma  Hyperlipidemia  Hypertension  Allergic rhinitis- Skin test 04/20/08  Recurrent rhinosinusitis  Several pneumonias and otitis as a child.  Salivary gland infection, s/p removal Dr Newman  Hemorrhoid  h/o pernicious anemia  Hemorrhoid  psoriasis (2010)  osteopenia, DEXA 4-10 , started Fosamax  Past Surgical History:  Cholecystectomy  tubal ligation  Hysterectomy, no oophorectomy  Salivary gland & LN resection 2002 ,Dr Newman  CAROTID ENDARTERECTOMY, RIGHT 2,000 -- due to trauma  Appendectomy  12-10 extensive cervical spine surgery, decompression, allograft placement  11-2009 R shoulder surgery, rotator cuff repair, bicipital tendin repair (Dr Norins)   Review of Systems  patient described the syncope as follows: She was eating breakfast at work, suddenly she felt nauseous, she has some epigastric discomfort described as gas, palpitations and started to sweat. Then she lost consciousness for about 30 seconds, it was witnessed by coworkers, no reported seizure, bladder or bowel incontinence. When she woke up she was slightly confused and had some tingling to her hands. Throughout these time, there was no chest pain, slurred speech, diplopia, motor deficits. She did have a mild headache, " like a band around the head". On further questioning, she was prescribed amlodipine in December 2012, about 4 weeks ago she noted increase in the lower extremity edema on self increase  her diuretic to one full tablet daily and sometimes 2 tablets daily. Her blood pressure has been great. Since the ER evaluation, she had no further symptoms.     Objective:   Physical Exam  Constitutional: She is oriented to person, place, and time. She appears well-developed and well-nourished. No distress.  Neck:       Nl carotid pulse  Cardiovascular: Normal rate, regular rhythm and normal heart sounds.   No murmur heard. Pulmonary/Chest: Effort normal and breath sounds normal. No respiratory distress. She has no wheezes. She has no rales.  Musculoskeletal: She exhibits edema (+/+++ pitting edema B, symmetric ).  Neurological: She is alert and oriented to person, place, and time.       Speech, gait, motor intact  Skin: She is not diaphoretic.  Psychiatric: She has a normal mood and affect. Her behavior is normal. Judgment and thought content normal.      Assessment & Plan:   

## 2011-04-20 NOTE — Patient Instructions (Addendum)
Check the  blood pressure 2 or 3 times a week, be sure it is less than 140/85. If it is consistently higher, let me know ER if problems resurface

## 2011-04-21 ENCOUNTER — Encounter: Payer: Self-pay | Admitting: Internal Medicine

## 2011-04-30 ENCOUNTER — Ambulatory Visit: Payer: Medicare Other | Admitting: Internal Medicine

## 2011-05-07 ENCOUNTER — Other Ambulatory Visit: Payer: Self-pay

## 2011-05-07 ENCOUNTER — Inpatient Hospital Stay (HOSPITAL_COMMUNITY)
Admission: EM | Admit: 2011-05-07 | Discharge: 2011-05-10 | DRG: 287 | Disposition: A | Discharge status: Home / Self Care | Payer: Medicare Other | Source: Ambulatory Visit | Attending: Internal Medicine | Admitting: Internal Medicine

## 2011-05-07 ENCOUNTER — Encounter (HOSPITAL_COMMUNITY): Payer: Self-pay | Admitting: Emergency Medicine

## 2011-05-07 ENCOUNTER — Emergency Department (HOSPITAL_COMMUNITY)
Admission: EM | Admit: 2011-05-07 | Discharge: 2011-05-07 | Disposition: A | Discharge status: Nursing Home (Includes ICF) | Payer: Medicare Other | Source: Home / Self Care | Attending: Family Medicine | Admitting: Family Medicine

## 2011-05-07 ENCOUNTER — Telehealth: Payer: Self-pay | Admitting: Internal Medicine

## 2011-05-07 ENCOUNTER — Emergency Department (HOSPITAL_COMMUNITY): Payer: Medicare Other

## 2011-05-07 ENCOUNTER — Encounter (HOSPITAL_COMMUNITY): Payer: Self-pay | Admitting: Neurology

## 2011-05-07 DIAGNOSIS — Z8679 Personal history of other diseases of the circulatory system: Secondary | ICD-10-CM

## 2011-05-07 DIAGNOSIS — Z8249 Family history of ischemic heart disease and other diseases of the circulatory system: Secondary | ICD-10-CM

## 2011-05-07 DIAGNOSIS — R001 Bradycardia, unspecified: Secondary | ICD-10-CM | POA: Diagnosis present

## 2011-05-07 DIAGNOSIS — Z87891 Personal history of nicotine dependence: Secondary | ICD-10-CM

## 2011-05-07 DIAGNOSIS — M949 Disorder of cartilage, unspecified: Secondary | ICD-10-CM | POA: Diagnosis present

## 2011-05-07 DIAGNOSIS — J309 Allergic rhinitis, unspecified: Secondary | ICD-10-CM

## 2011-05-07 DIAGNOSIS — I6529 Occlusion and stenosis of unspecified carotid artery: Secondary | ICD-10-CM

## 2011-05-07 DIAGNOSIS — Z9889 Other specified postprocedural states: Secondary | ICD-10-CM

## 2011-05-07 DIAGNOSIS — I498 Other specified cardiac arrhythmias: Secondary | ICD-10-CM | POA: Diagnosis present

## 2011-05-07 DIAGNOSIS — Z7982 Long term (current) use of aspirin: Secondary | ICD-10-CM

## 2011-05-07 DIAGNOSIS — F411 Generalized anxiety disorder: Secondary | ICD-10-CM

## 2011-05-07 DIAGNOSIS — D510 Vitamin B12 deficiency anemia due to intrinsic factor deficiency: Secondary | ICD-10-CM | POA: Diagnosis present

## 2011-05-07 DIAGNOSIS — E785 Hyperlipidemia, unspecified: Secondary | ICD-10-CM | POA: Diagnosis present

## 2011-05-07 DIAGNOSIS — Z8673 Personal history of transient ischemic attack (TIA), and cerebral infarction without residual deficits: Secondary | ICD-10-CM

## 2011-05-07 DIAGNOSIS — R55 Syncope and collapse: Secondary | ICD-10-CM | POA: Diagnosis present

## 2011-05-07 DIAGNOSIS — Z79899 Other long term (current) drug therapy: Secondary | ICD-10-CM

## 2011-05-07 DIAGNOSIS — M1990 Unspecified osteoarthritis, unspecified site: Secondary | ICD-10-CM

## 2011-05-07 DIAGNOSIS — R42 Dizziness and giddiness: Secondary | ICD-10-CM | POA: Diagnosis present

## 2011-05-07 DIAGNOSIS — M899 Disorder of bone, unspecified: Secondary | ICD-10-CM

## 2011-05-07 DIAGNOSIS — T448X5A Adverse effect of centrally-acting and adrenergic-neuron-blocking agents, initial encounter: Secondary | ICD-10-CM | POA: Diagnosis present

## 2011-05-07 DIAGNOSIS — M7130 Other bursal cyst, unspecified site: Secondary | ICD-10-CM

## 2011-05-07 DIAGNOSIS — I10 Essential (primary) hypertension: Secondary | ICD-10-CM | POA: Diagnosis present

## 2011-05-07 DIAGNOSIS — L408 Other psoriasis: Secondary | ICD-10-CM

## 2011-05-07 DIAGNOSIS — I2510 Atherosclerotic heart disease of native coronary artery without angina pectoris: Secondary | ICD-10-CM | POA: Diagnosis present

## 2011-05-07 DIAGNOSIS — R0789 Other chest pain: Principal | ICD-10-CM | POA: Diagnosis present

## 2011-05-07 LAB — POCT I-STAT TROPONIN I

## 2011-05-07 LAB — CBC
HCT: 40.4 % (ref 36.0–46.0)
Hemoglobin: 13.2 g/dL (ref 12.0–15.0)
MCV: 89 fL (ref 78.0–100.0)
RBC: 4.72 MIL/uL (ref 3.87–5.11)
RBC: 4.54 MIL/uL (ref 3.87–5.11)
WBC: 6.6 10*3/uL (ref 4.0–10.5)
WBC: 7.4 10*3/uL (ref 4.0–10.5)

## 2011-05-07 LAB — BASIC METABOLIC PANEL
BUN: 27 mg/dL — ABNORMAL HIGH (ref 6–23)
Chloride: 101 mEq/L (ref 96–112)
Glucose, Bld: 87 mg/dL (ref 70–99)
Potassium: 4.3 mEq/L (ref 3.5–5.1)

## 2011-05-07 LAB — DIFFERENTIAL
Lymphocytes Relative: 36 % (ref 12–46)
Lymphs Abs: 2.4 10*3/uL (ref 0.7–4.0)
Monocytes Relative: 10 % (ref 3–12)
Neutro Abs: 3.3 10*3/uL (ref 1.7–7.7)
Neutrophils Relative %: 50 % (ref 43–77)

## 2011-05-07 LAB — APTT: aPTT: 33 seconds (ref 24–37)

## 2011-05-07 MED ORDER — SODIUM CHLORIDE 0.9 % IJ SOLN
3.0000 mL | Freq: Two times a day (BID) | INTRAMUSCULAR | Status: DC
  Administered 2011-05-08 – 2011-05-10 (×2): 3 mL via INTRAVENOUS

## 2011-05-07 MED ORDER — ASPIRIN EC 81 MG PO TBEC
81.0000 mg | DELAYED_RELEASE_TABLET | Freq: Every day | ORAL | Status: DC
  Administered 2011-05-08: 81 mg via ORAL
  Filled 2011-05-07 (×2): qty 1

## 2011-05-07 MED ORDER — CYANOCOBALAMIN 1000 MCG/ML IJ SOLN
1000.0000 ug | INTRAMUSCULAR | Status: DC
  Administered 2011-05-08: 1000 ug via INTRAMUSCULAR
  Filled 2011-05-07: qty 1

## 2011-05-07 MED ORDER — ALUM & MAG HYDROXIDE-SIMETH 200-200-20 MG/5ML PO SUSP: 30.0000 mL | Freq: Four times a day (QID) | ORAL | Status: DC | PRN

## 2011-05-07 MED ORDER — CLOBETASOL PROPIONATE 0.05 % EX CREA
1.0000 "application " | TOPICAL_CREAM | Freq: Two times a day (BID) | CUTANEOUS | Status: DC | PRN
  Filled 2011-05-07: qty 15

## 2011-05-07 MED ORDER — ACETAMINOPHEN 325 MG PO TABS
650.0000 mg | ORAL_TABLET | Freq: Four times a day (QID) | ORAL | Status: DC | PRN
  Administered 2011-05-08 – 2011-05-09 (×3): 650 mg via ORAL
  Filled 2011-05-07 (×3): qty 2

## 2011-05-07 MED ORDER — CLONAZEPAM 0.5 MG PO TABS
0.5000 mg | ORAL_TABLET | Freq: Three times a day (TID) | ORAL | Status: DC | PRN
  Administered 2011-05-09: 0.5 mg via ORAL
  Filled 2011-05-07: qty 1

## 2011-05-07 MED ORDER — HYDROCHLOROTHIAZIDE 25 MG PO TABS
12.5000 mg | ORAL_TABLET | Freq: Every day | ORAL | Status: DC
  Administered 2011-05-08: 12.5 mg via ORAL
  Filled 2011-05-07 (×2): qty 0.5

## 2011-05-07 MED ORDER — FLUTICASONE PROPIONATE 50 MCG/ACT NA SUSP
2.0000 | Freq: Every day | NASAL | Status: DC | PRN
  Filled 2011-05-07: qty 16

## 2011-05-07 MED ORDER — HYDRALAZINE HCL 25 MG PO TABS
25.0000 mg | ORAL_TABLET | Freq: Three times a day (TID) | ORAL | Status: DC
  Administered 2011-05-08 – 2011-05-10 (×8): 25 mg via ORAL
  Filled 2011-05-07 (×13): qty 1

## 2011-05-07 MED ORDER — SODIUM CHLORIDE 0.9 % IV SOLN
Freq: Once | INTRAVENOUS | Status: AC
  Administered 2011-05-07: 18:00:00 via INTRAVENOUS

## 2011-05-07 MED ORDER — ACETAMINOPHEN 650 MG RE SUPP: 650.0000 mg | Freq: Four times a day (QID) | RECTAL | Status: DC | PRN

## 2011-05-07 MED ORDER — SODIUM CHLORIDE 0.9 % IV SOLN
INTRAVENOUS | Status: DC
Start: 2011-05-07 — End: 2011-05-09
  Administered 2011-05-08 – 2011-05-09 (×3): via INTRAVENOUS

## 2011-05-07 MED ORDER — SENNOSIDES-DOCUSATE SODIUM 8.6-50 MG PO TABS
1.0000 | ORAL_TABLET | Freq: Every evening | ORAL | Status: DC | PRN
  Filled 2011-05-07: qty 1

## 2011-05-07 MED ORDER — NITROGLYCERIN 0.3 MG SL SUBL
0.3000 mg | SUBLINGUAL_TABLET | SUBLINGUAL | Status: DC | PRN
  Filled 2011-05-07: qty 100

## 2011-05-07 MED ORDER — ONDANSETRON HCL 4 MG/2ML IJ SOLN: 4.0000 mg | Freq: Four times a day (QID) | INTRAMUSCULAR | Status: DC | PRN

## 2011-05-07 MED ORDER — IBUPROFEN 600 MG PO TABS
600.0000 mg | ORAL_TABLET | Freq: Four times a day (QID) | ORAL | Status: DC | PRN
  Filled 2011-05-07: qty 1

## 2011-05-07 MED ORDER — LOSARTAN POTASSIUM 50 MG PO TABS
100.0000 mg | ORAL_TABLET | Freq: Every day | ORAL | Status: DC
  Administered 2011-05-08 – 2011-05-09 (×2): 100 mg via ORAL
  Filled 2011-05-07 (×4): qty 2

## 2011-05-07 MED ORDER — PANTOPRAZOLE SODIUM 40 MG PO TBEC
40.0000 mg | DELAYED_RELEASE_TABLET | Freq: Every day | ORAL | Status: DC
  Administered 2011-05-08 – 2011-05-10 (×3): 40 mg via ORAL
  Filled 2011-05-07 (×3): qty 1

## 2011-05-07 MED ORDER — ASPIRIN 81 MG PO TABS: 81.0000 mg | ORAL_TABLET | Freq: Every day | ORAL | Status: DC

## 2011-05-07 MED ORDER — ATORVASTATIN CALCIUM 80 MG PO TABS
80.0000 mg | ORAL_TABLET | Freq: Every day | ORAL | Status: DC
  Filled 2011-05-07 (×4): qty 1

## 2011-05-07 MED ORDER — ONDANSETRON HCL 4 MG PO TABS: 4.0000 mg | ORAL_TABLET | Freq: Four times a day (QID) | ORAL | Status: DC | PRN

## 2011-05-07 MED ORDER — ENOXAPARIN SODIUM 40 MG/0.4ML ~~LOC~~ SOLN
40.0000 mg | SUBCUTANEOUS | Status: DC
  Administered 2011-05-08 – 2011-05-10 (×2): 40 mg via SUBCUTANEOUS
  Filled 2011-05-07 (×3): qty 0.4

## 2011-05-07 NOTE — Telephone Encounter (Signed)
Patient spoke to call a nurse & was informed to call 911 or go to the emergency room. Patient does not want to go unless Dr. Drue Novel specifically tells her to from his own mouth. (all per the patient) Her BP was 172/50 & Pulse rate was 47 She believes it is the Carvediol making her feel short of breath & causing slow heart rate. Does he want her to go ahead & go to ED or come in tomorrow maybe? Patient is at home ph# [phone]

## 2011-05-07 NOTE — ED Notes (Signed)
Admitting at bedside 

## 2011-05-07 NOTE — ED Notes (Signed)
PT ON 2 LITERS N/C.SATS 98%.MONITOR ON IN S/B 50'S.NO DISTRESS NOTED.PT RESTING COMFORTABLE.EMS CALLED

## 2011-05-07 NOTE — ED Provider Notes (Signed)
History     CSN: [id number]  Arrival date & time 05/07/11  1843   First MD Initiated Contact with Patient 05/07/11 1843      Chief Complaint  Patient presents with  . Chest Pain  . Hypertension    (Consider location/radiation/quality/duration/timing/severity/associated sxs/prior treatment) Patient is a 77 y.o. female presenting with chest pain and hypertension. The history is provided by the patient and medical records.  Chest Pain Primary symptoms include shortness of breath. Pertinent negatives for primary symptoms include no fever, no cough, no palpitations, no nausea and no vomiting.  Her past medical history is significant for hypertension.    Hypertension Associated symptoms include chest pain and shortness of breath.   the patient is a 77 year old, female, with a history of hypertension, who presents to emergency department complaining of having no energy, feeling washed out, and chest tightness.  When she walks.  She has felt this way for approximately 2 weeks after her primary care physician.  Changed her to Coreg and stop her Diovan.  When she ambulates.  She feels as if she has no energy and she gets chest tightness.  However, she denies nausea, vomiting, diaphoresis.  She does have shortness of breath.  When she ambulates.  She denies smoking.  She denies a history of coronary artery disease, congestive heart failure, pain or swelling.  She has not had a fever, chills, or urinary tract symptoms.  She states that usually she is a very active lady, and since starting to Coreg.  She has had no energy.  Presently, she just feels as if she has no energy, but she denies pain, or any other symptoms.  Past Medical History  Diagnosis Date  . CAD (coronary artery disease)     stress test 2003 "fix defect"  . Cerebrovascular accident 2000    Carotid endarterectomy,right-- due to trauma  . Hyperlipidemia   . Hypertension   . Allergic rhinitis     skin test 04/20/08  . Pneumonia    several pneumonias as a child, and otitis  . Salivary gland infection     s/p removal Dr Ezzard Standing  . Hemorrhoid   . Psoriasis 2010  . Osteopenia 4/10    DEXA,started fosamx  . Shingles 02/2010  . ANEMIA, PERNICIOUS 07/31/2006    Past Surgical History  Procedure Date  . Cholecystectomy   . Tubal ligation   . Abdominal hysterectomy     no oophorectomy  . Salivary gland surgery     & LN resection 2002, Dr.Newman  . Carotid endarterectomy     right, 2000 due to trauma   . Appendectomy   . Spine surgery 12/10    extensive, decompression,allograft placement  . Shoulder surgery 11/2009    right, rotator cuff repiar, biciptial tendin repiar (Dr Debby Bud)    Family History  Problem Relation Age of Onset  . Hypertension Mother   . Hyperlipidemia Mother   . Colon cancer Mother   . Coronary artery disease Father   . Parkinsonism Father   . Heart disease Father   . Coronary artery disease Brother 87  . Breast cancer Neg Hx     History  Substance Use Topics  . Smoking status: Former Smoker    Types: Cigarettes    Quit date: 11/20/1992  . Smokeless tobacco: Never Used  . Alcohol Use: 0.5 oz/week    1 drink(s) per week    OB History    Grav Para Term Preterm Abortions TAB SAB Ect Mult Living  Review of Systems  Constitutional: Positive for activity change. Negative for fever and chills.  Eyes: Negative for visual disturbance.  Respiratory: Positive for chest tightness and shortness of breath. Negative for cough.        Dyspnea on exertion Chest tightness with exertion  Cardiovascular: Positive for chest pain. Negative for palpitations and leg swelling.  Gastrointestinal: Negative for nausea and vomiting.  All other systems reviewed and are negative.    Allergies  Amoxicillin; Clarithromycin; Codeine; Cyclobenzaprine hcl; Hydrocodone; Morphine sulfate; and Tramadol hcl  Home Medications   Current Outpatient Rx  Name Route Sig Dispense Refill  .  ASPIRIN 81 MG PO TABS Oral Take 81 mg by mouth daily.      Marland Kitchen CARVEDILOL 12.5 MG PO TABS Oral Take 1 tablet (12.5 mg total) by mouth 2 (two) times daily with a meal. 60 tablet 3  . CLOBETASOL PROPIONATE 0.05 % EX CREA Topical Apply 1 application topically 2 (two) times daily.    Marland Kitchen CLONAZEPAM 1 MG PO TABS  Take 1 tablet by mouth twice a day and 2 tabs at bedtime as needed for anxiety/insomnia. 90 tablet 1  . CYANOCOBALAMIN 1000 MCG/ML IJ SOLN Intramuscular Inject 1,000 mcg into the muscle every 30 (thirty) days.    Marland Kitchen FLUTICASONE PROPIONATE 50 MCG/ACT NA SUSP Nasal Place 2 sprays into the nose daily. 48 g 3  . HYDROCHLOROTHIAZIDE 25 MG PO TABS Oral Take 0.5 tablets (12.5 mg total) by mouth daily. 90 tablet 3  . LOSARTAN POTASSIUM 100 MG PO TABS Oral Take 1 tablet (100 mg total) by mouth daily. 30 tablet 6  . ROSUVASTATIN CALCIUM 40 MG PO TABS Oral Take 40 mg by mouth at bedtime.      There were no vitals taken for this visit.  Physical Exam  Vitals reviewed. Constitutional: She is oriented to person, place, and time. She appears well-developed and well-nourished.  HENT:  Head: Normocephalic and atraumatic.  Eyes: Conjunctivae are normal. Pupils are equal, round, and reactive to light.  Neck: Normal range of motion. Neck supple.       No carotid bruits or JVD  Cardiovascular: Regular rhythm.  Exam reveals gallop.   No murmur heard.      Bradycardia with gallop  Pulmonary/Chest: Effort normal. No respiratory distress.  Abdominal: Soft. She exhibits no distension. There is no tenderness.  Musculoskeletal: Normal range of motion. She exhibits no edema and no tenderness.  Neurological: She is oriented to person, place, and time.  Skin: Skin is warm and dry.  Psychiatric: She has a normal mood and affect. Thought content normal.    ED Course  Procedures (including critical care time) 77 year old lady, with hypertension, presents with decreased energy, and, chest tightness with exertion.  For  approximately 2 weeks after she was started on Coreg.  Presently, she is asymptomatic except for not having any energy.  She got a sinus bradycardia.  I suspect that this is all iatrogenic.  We will perform laboratory testing, a chest x-ray, and EKG, and monitor.   Labs Reviewed  CBC  DIFFERENTIAL  BASIC METABOLIC PANEL   No results found.   No diagnosis found.  ED ECG REPORT   Date: 05/07/2011  EKG Time: 7:27 PM  Rate: 51  Rhythm: sinus bradycardia,  unchanged from previous tracings  Axis: nl  Intervals:none  ST&T Change: none  Narrative Interpretation: sinus bradycardia       9:14 PM Spoke with triad md. He will admit for iatrogenic bradycardia, fatigue and  chest tightness with exertion.    MDM  Iatrogenic bradycardia with decreased energy No signs of cardiac ischemia.        Nicholes Stairs, MD 05/07/11 2115

## 2011-05-07 NOTE — ED Notes (Signed)
Per ems-pt comes from [facility], reporting PCP changed hypertensive medication to Carvedilol. Pt reporting chest tightness, numbness in fingers since middle of Feb. While at [facility] pt seen to have HR 45-50's. PT reporting feeling weak. Denying any SOB. Alert and oriented. NAD. UCC unable to start IV. Will continue to monitor pt, placing pt on monitor

## 2011-05-07 NOTE — H&P (Signed)
[name] MRN: [id number] DOB/AGE: Nov 26, 1934 77 y.o. Primary Care Physician:Jose Drue Novel, MD, MD Admit date: 05/07/2011 Chief Complaint: Dizziness, weakness, chest pressure HPI:  Tammy [name] is a pleasant 77 year old Caucasian female history of coronary artery disease, hyperlipidemia, hypertension, prior CVA, status post right carotid endarterectomy secondary to trauma, history of anemia who presents to the ED with a 2 week history of dizziness, bradycardia, generalized weakness, feeling SOB washout, and the chest heaviness on exertion. Patient states that she was seen in the ED about 2 weeks prior to admission when she had passed out and was felt this was vasovagal in nature. Patient states that her blood pressure regimen was recently changed. Patient states that she was changed from Diovan to Coreg per PCP secondary to financial issues. Patient states that ever since she started taking the Coreg she's been feeling very dizzy, with some generalized weakness, bradycardic as well. 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 Patient denies any radiation of this chest heaviness. Patient denies any palpitations. Patient denies any recent syncope over the past one week, patient denies any paroxysmal nocturnal dyspnea, patient denies any orthopnea, no fever, no chills, no nausea, no vomiting, no diarrhea, no constipation, no dysuria. Patient does endorse some generalized weakness. Patient also endorses some lower extremity edema whenever she is on her feet which improves when she lays down. Patient also with complaints of occasional numbness in her hands bilaterally. Patient was seen in the ED EKG done did show a sinus bradycardia with poor R-wave progression. First of troponin done was negative. Be met done had a BUN of 27 a creatinine of 1.21 otherwise was within normal  limits. First set of troponin was 0.01. CBC done had a platelets of 130 otherwise was within normal limits. Will call to admit the patient for further evaluation and management.  Past Medical History  Diagnosis Date  . CAD (coronary artery disease)     stress test 2003 "fix defect"  . Cerebrovascular accident 2000    Carotid endarterectomy,right-- due to trauma  . Hyperlipidemia   . Hypertension   . Allergic rhinitis     skin test 04/20/08  . Pneumonia     several pneumonias as a child, and otitis  . Salivary gland infection     s/p removal Dr Ezzard Standing  . Hemorrhoid   . Psoriasis 2010  . Osteopenia 4/10    DEXA,started fosamx  . Shingles 02/2010  . ANEMIA, PERNICIOUS 07/31/2006    Past Surgical History  Procedure Date  . Cholecystectomy   . Tubal ligation   . Abdominal hysterectomy     no oophorectomy  . Salivary gland surgery     & LN resection 2002, Dr.Newman  . Carotid endarterectomy     right, 2000 due to trauma   . Appendectomy   . Spine surgery 12/10    extensive, decompression,allograft placement  . Shoulder surgery 11/2009    right, rotator cuff repiar, biciptial tendin repiar (Dr Debby Bud)    Prior to Admission medications   Medication Sig Start Date End Date Taking? Authorizing Provider  aspirin 81 MG tablet Take 81 mg by mouth at bedtime.    Yes Historical Provider, MD  carvedilol (COREG) 12.5 MG tablet Take 12.5 mg by mouth 2 (two) times daily with a meal. 04/20/11 04/19/12 Yes Wanda Plump, MD  clobetasol cream (TEMOVATE) 0.05 % Apply  1 application topically 2 (two) times daily as needed. For psoriasis   Yes Historical Provider, MD  clonazePAM (KLONOPIN) 1 MG tablet Take 0.5 mg by mouth 3 (three) times daily as needed. for anxiety/insomnia. 06/15/10  Yes Wanda Plump, MD  cyanocobalamin (,VITAMIN B-12,) 1000 MCG/ML injection Inject 1,000 mcg into the muscle every 30 (thirty) days.   Yes Historical Provider, MD  fluticasone (FLONASE) 50 MCG/ACT nasal spray Place 2 sprays  into the nose daily as needed. For congestion 12/08/10  Yes Wanda Plump, MD  hydrochlorothiazide (HYDRODIURIL) 25 MG tablet Take 12.5 mg by mouth at bedtime. 01/01/11  Yes Wanda Plump, MD  losartan (COZAAR) 100 MG tablet Take 100 mg by mouth at bedtime. 02/23/11 02/23/12 Yes Wanda Plump, MD  rosuvastatin (CRESTOR) 40 MG tablet Take 40 mg by mouth at bedtime.   Yes Historical Provider, MD    Allergies:  Allergies  Allergen Reactions  . Amoxicillin     REACTION: sick on stomach  . Clarithromycin     REACTION: sick on stomach  . Codeine     REACTION: "knocks me out"  . Cyclobenzaprine Hcl     REACTION: "paralysis"  . Hydrocodone     REACTION:"knocks me out"  . Morphine Sulfate     REACTION: "stop my heart"  . Percocet (Oxycodone-Acetaminophen) Nausea Only  . Tramadol Hcl     REACTION: nausea    Family History  Problem Relation Age of Onset  . Hypertension Mother   . Hyperlipidemia Mother   . Colon cancer Mother   . Coronary artery disease Father   . Parkinsonism Father   . Heart disease Father   . Coronary artery disease Brother 16  . Breast cancer Neg Hx     Social History:  reports that she quit smoking about 18 years ago. Her smoking use included Cigarettes. She has never used smokeless tobacco. She reports that she drinks about .5 ounces of alcohol per week. She reports that she does not use illicit drugs.  ROS: All systems reviewed with the patient and was positive as per HPI otherwise all other systems are negative.  PHYSICAL EXAM: Blood pressure 133/54, pulse 52, temperature 97.7 F (36.5 C), temperature source Oral, resp. rate 20, SpO2 97.00%. General: Well-developed well-nourished in no acute cardiopulmonary distress.  HEENT: Normocephalic atraumatic. Pupils equal round and reactive to light and accommodation. Extraocular movements intact. Oropharynx is clear, no lesions no exudates. Neck is supple no lymphadenopathy. No bruits, no goiter. Heart: Bradycardia, without  murmurs, rubs, gallops. Lungs: Clear to auscultation bilaterally. Abdomen: Soft, nontender, nondistended, positive bowel sounds. Extremities: No clubbing cyanosis or edema with positive pedal pulses. Neuro: Alert and oriented x3. Cranial nerves II through XII are grossly intact. No focal deficits.     EKG: Sinus bradycardia with poor R-wave progression.  No results found for this or any previous visit (from the past 240 hour(s)).   Lab results:  [facility] 05/07/11 1903  NA 139  K 4.3  CL 101  CO2 23  GLUCOSE 87  BUN 27*  CREATININE 1.21*  CALCIUM 10.3  MG --  PHOS --   No results found for this basename: AST:2,ALT:2,ALKPHOS:2,BILITOT:2,PROT:2,ALBUMIN:2 in the last 72 hours No results found for this basename: LIPASE:2,AMYLASE:2 in the last 72 hours  Basename 05/07/11 1903  WBC 6.6  NEUTROABS 3.3  HGB 13.2  HCT 42.4  MCV 89.8  PLT 130*   No results found for this basename: CKTOTAL:3,CKMB:3,CKMBINDEX:3,TROPONINI:3 in the last 72 hours No  components found with this basename: POCBNP:3 No results found for this basename: DDIMER in the last 72 hours No results found for this basename: HGBA1C:2 in the last 72 hours No results found for this basename: CHOL:2,HDL:2,LDLCALC:2,TRIG:2,CHOLHDL:2,LDLDIRECT:2 in the last 72 hours No results found for this basename: TSH,T4TOTAL,FREET3,T3FREE,THYROIDAB in the last 72 hours No results found for this basename: VITAMINB12:2,FOLATE:2,FERRITIN:2,TIBC:2,IRON:2,RETICCTPCT:2 in the last 72 hours Imaging results:  Dg Chest 2 View  04/16/2011  *RADIOLOGY REPORT*  Clinical Data: Nausea, syncope, history TIA, hypertension, carotid arterial disease  CHEST - 2 VIEW  Comparison: 08/10/2010  Findings: Normal heart size, mediastinal contours, and pulmonary vascularity. Emphysematous changes without infiltrate or effusion. Minimal linear subsegmental atelectasis versus scarring left base. Clothing artifacts project over left upper lobe. No pneumothorax.  Osseous demineralization with thoracolumbar scoliosis.  IMPRESSION: Emphysematous changes with minimal atelectasis or scarring left base. No acute abnormalities.  Original Report Authenticated By: Lollie Marrow, M.D.   Dg Chest Port 1 View  05/07/2011  *RADIOLOGY REPORT*  Clinical Data: Shortness of breath.  Dyspnea.  PORTABLE CHEST - 1 VIEW  Comparison: 02/13/2012  Findings: 2005 hours. The lungs are clear without focal infiltrate, edema, pneumothorax or pleural effusion. Interstitial markings are diffusely coarsened with chronic features. Cardiopericardial silhouette is at upper limits of normal for size. Telemetry leads overlie the chest.  IMPRESSION: No evidence for focal consolidation nor overt pulmonary edema.  Original Report Authenticated By: ERIC A. MANSELL, M.D.   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 This is associated with her weakness and dizziness as well. Patient was recently started on Coreg. Due to patient's cardiac history history of hypertension history of hyperlipidemia history of a prior stroke will go ahead and add that the patient to telemetry. We'll cycle cardiac enzymes every 8 hours x3. Will check a TSH. We'll get a 2-D echo. Check a fasting lipid panel. We'll place on aspirin, oxygen, nitroglycerin as needed. Will consult with cardiology for further evaluation and management.  #2 dizziness/weakness/near syncope/bradycardia Patient's symptoms develop right after she was changed to Coreg. Likely medication induced. Will hold patient's Coreg while in house. Will monitor on telemetry. We'll cycle cardiac enzymes every 8 hours x3. Will check  orthostatics. Will check a TSH. Will gently hydrate with IV fluids. We'll monitor follow.  #3 hyperlipidemia Check a fasting lipid panel. Continue home dose statin.  #4 hypertension We will hold patient's Coreg secondary to problem #2. Will continue patient on her home regimen of HCTZ and losartan. Will start patient on hydralazine 25 mg 3 times a day. And follow.  #5 history of CVA Stable. Continue aspirin.  #6 history of pernicious anemia H&H is stable.  #7 prophylaxis PPI for GI prophylaxis. Lovenox for DVT prophylaxis.   Tammy [name] 05/07/2011, 10:26 PM

## 2011-05-07 NOTE — ED Notes (Signed)
VS done at 1742 by this Clinical research associate.

## 2011-05-07 NOTE — ED Provider Notes (Addendum)
History     CSN: [id number]  Arrival date & time 05/07/11  1732   None     Chief Complaint  Patient presents with  . Dizziness  . Fatigue    (Consider location/radiation/quality/duration/timing/severity/associated sxs/prior treatment) Patient is a 77 y.o. female presenting with syncope. The history is provided by the patient.  Loss of Consciousness This is a recurrent problem. The current episode started more than 1 week ago. The problem has been gradually worsening. Pertinent negatives include no chest pain and no shortness of breath. Associated symptoms comments: Near syncope poss med related.    Past Medical History  Diagnosis Date  . CAD (coronary artery disease)     stress test 2003 "fix defect"  . Cerebrovascular accident 2000    Carotid endarterectomy,right-- due to trauma  . Hyperlipidemia   . Hypertension   . Allergic rhinitis     skin test 04/20/08  . Pneumonia     several pneumonias as a child, and otitis  . Salivary gland infection     s/p removal Dr Ezzard Standing  . Hemorrhoid   . Psoriasis 2010  . Osteopenia 4/10    DEXA,started fosamx  . Shingles 02/2010  . ANEMIA, PERNICIOUS 07/31/2006    Past Surgical History  Procedure Date  . Cholecystectomy   . Tubal ligation   . Abdominal hysterectomy     no oophorectomy  . Salivary gland surgery     & LN resection 2002, Dr.Newman  . Carotid endarterectomy     right, 2000 due to trauma   . Appendectomy   . Spine surgery 12/10    extensive, decompression,allograft placement  . Shoulder surgery 11/2009    right, rotator cuff repiar, biciptial tendin repiar (Dr Debby Bud)    Family History  Problem Relation Age of Onset  . Hypertension Mother   . Hyperlipidemia Mother   . Colon cancer Mother   . Coronary artery disease Father   . Parkinsonism Father   . Heart disease Father   . Coronary artery disease Brother 48  . Breast cancer Neg Hx     History  Substance Use Topics  . Smoking status: Former Smoker   Types: Cigarettes    Quit date: 11/20/1992  . Smokeless tobacco: Never Used  . Alcohol Use: 0.5 oz/week    1 drink(s) per week    OB History    Grav Para Term Preterm Abortions TAB SAB Ect Mult Living                  Review of Systems  Respiratory: Negative for chest tightness and shortness of breath.   Cardiovascular: Positive for syncope. Negative for chest pain, palpitations and leg swelling.  Neurological: Positive for syncope and weakness.    Allergies  Amoxicillin; Clarithromycin; Codeine; Cyclobenzaprine hcl; Hydrocodone; Morphine sulfate; and Tramadol hcl  Home Medications   Current Outpatient Rx  Name Route Sig Dispense Refill  . CARVEDILOL 12.5 MG PO TABS Oral Take 1 tablet (12.5 mg total) by mouth 2 (two) times daily with a meal. 60 tablet 3  . CLONAZEPAM 1 MG PO TABS  Take 1 tablet by mouth twice a day and 2 tabs at bedtime as needed for anxiety/insomnia. 90 tablet 1  . ASPIRIN 81 MG PO TABS Oral Take 81 mg by mouth daily.      Marland Kitchen CLOBETASOL PROPIONATE 0.05 % EX CREA Topical Apply 1 application topically 2 (two) times daily.    . CYANOCOBALAMIN 1000 MCG/ML IJ SOLN Intramuscular Inject 1,000 mcg  into the muscle every 30 (thirty) days.    Marland Kitchen FLUTICASONE PROPIONATE 50 MCG/ACT NA SUSP Nasal Place 2 sprays into the nose daily. 48 g 3  . HYDROCHLOROTHIAZIDE 25 MG PO TABS Oral Take 0.5 tablets (12.5 mg total) by mouth daily. 90 tablet 3  . LOSARTAN POTASSIUM 100 MG PO TABS Oral Take 1 tablet (100 mg total) by mouth daily. 30 tablet 6  . ROSUVASTATIN CALCIUM 40 MG PO TABS Oral Take 40 mg by mouth at bedtime.      BP 160/73  Pulse 58  Temp(Src) 98.2 F (36.8 C) (Oral)  Resp 18  SpO2 98%  Physical Exam  Nursing note and vitals reviewed. Constitutional: She is oriented to person, place, and time. She appears well-developed and well-nourished.  HENT:  Head: Normocephalic.  Eyes: Pupils are equal, round, and reactive to light.  Cardiovascular: Regular rhythm, normal  heart sounds and intact distal pulses.  Bradycardia present.   Pulmonary/Chest: Effort normal and breath sounds normal.  Neurological: She is alert and oriented to person, place, and time.  Skin: Skin is warm and dry.  Psychiatric: She has a normal mood and affect. Her behavior is normal. Judgment and thought content normal.    ED Course  Procedures (including critical care time)  Labs Reviewed - No data to display No results found.   1. Near syncope       MDM  ecg- sinus brady 59, no acute change.        Barkley Bruns, MD 05/07/11 Rickey Primus  Linna Hoff, MD 05/14/11 2034

## 2011-05-07 NOTE — ED Notes (Addendum)
PT HERE WITH 2 WEEKS FATIGUE AND DIZZINESS S/P SYNCOPE EPISODE X 2WEEKS AGO WITHOUT INJURY OR LOC.PT WENT TO ER FOR SX AND DIAG WITH VAGEL AND F/U WITH PCP DR.PAZ AND BP MEDS CHANGED TO COREG 12.5 MG.PT STATES SINCE TAKING MED SHE HASN'T HAD NO ENERGY AND FEELS FAINT.ALSO REPORTS OF CHEST TIGHTNESS WITH SX NUMBNESS TO FINGERS.NO DISTRESS SEEN.HR HAS BEEN IN THE 40'S WHEN PT CALLED PCP OFFICE AND TOLD TO GO STRAIGHT TO ER

## 2011-05-07 NOTE — ED Notes (Signed)
IV team paged for IV start and blood draw. 

## 2011-05-07 NOTE — Telephone Encounter (Signed)
[facility] Triage Call Report Triage Record Num: [id number] Operator: Jeraldine Loots Patient Name: Tammy [name] Call Date & Time: 05/07/2011 2:25:59PM Patient Phone: [phone] PCP: Nolon Rod. Paz Patient Gender: Female PCP Fax : Patient DOB: 1934-03-09 Practice Name: [facility] Reason for Call: Caller: Tala/Patient; PCP: Willow Ora; CB#: [phone]; Her b/p meds were changed recently and her readings have 110/59 but her heart rate is ranging around 40-42. She has extreme fatigue. Has some dizziness at times. Has some shortness of breath with exertion. Has had numbness in both arms intermittently. States that the sx are very similar to what she experienced when she had her TIA. Advised 911. Protocol(s) Used: Dizziness or Vertigo Recommended Outcome per Protocol: Activate EMS 911 Reason for Outcome: History of stroke OR transient ischemic attack (TIA) AND symptoms are similar Sudden, severe disabling head pain OR caller spontaneously verbalizes "worst headache of my life" Care Advice: ~  I called patient, patient was still at home and expressed how much she did not want to go to the ER, patient was advise we highly recommend that she seeks medical attention to r/o TIA and Heart related concerns. Patient agreed that she will seek medical attention

## 2011-05-07 NOTE — Telephone Encounter (Signed)
Please advise 

## 2011-05-08 ENCOUNTER — Inpatient Hospital Stay (HOSPITAL_COMMUNITY): Payer: Medicare Other

## 2011-05-08 DIAGNOSIS — I498 Other specified cardiac arrhythmias: Secondary | ICD-10-CM

## 2011-05-08 DIAGNOSIS — R079 Chest pain, unspecified: Secondary | ICD-10-CM

## 2011-05-08 DIAGNOSIS — I059 Rheumatic mitral valve disease, unspecified: Secondary | ICD-10-CM

## 2011-05-08 LAB — HEPATIC FUNCTION PANEL
ALT: 44 U/L — ABNORMAL HIGH (ref 0–35)
Alkaline Phosphatase: 89 U/L (ref 39–117)
Bilirubin, Direct: 0.1 mg/dL (ref 0.0–0.3)
Indirect Bilirubin: 0.6 mg/dL (ref 0.3–0.9)
Total Bilirubin: 0.7 mg/dL (ref 0.3–1.2)

## 2011-05-08 LAB — CBC
Hemoglobin: 11.2 g/dL — ABNORMAL LOW (ref 12.0–15.0)
MCH: 27.8 pg (ref 26.0–34.0)
MCHC: 31.2 g/dL (ref 30.0–36.0)

## 2011-05-08 LAB — URINALYSIS, ROUTINE W REFLEX MICROSCOPIC
Ketones, ur: NEGATIVE mg/dL
Nitrite: NEGATIVE
Protein, ur: NEGATIVE mg/dL
Urobilinogen, UA: 0.2 mg/dL (ref 0.0–1.0)

## 2011-05-08 LAB — CARDIAC PANEL(CRET KIN+CKTOT+MB+TROPI)
CK, MB: 3.3 ng/mL (ref 0.3–4.0)
CK, MB: 2.8 ng/mL (ref 0.3–4.0)
Total CK: 132 U/L (ref 7–177)
Total CK: 127 U/L (ref 7–177)
Troponin I: <0.3 ng/mL (ref <0.30)

## 2011-05-08 LAB — BASIC METABOLIC PANEL
BUN: 23 mg/dL (ref 6–23)
GFR calc non Af Amer: 45 mL/min — ABNORMAL LOW (ref >90)
Glucose, Bld: 77 mg/dL (ref 70–99)
Potassium: 3.5 mEq/L (ref 3.5–5.1)

## 2011-05-08 LAB — CREATININE, SERUM
GFR calc Af Amer: 51 mL/min — ABNORMAL LOW (ref >90)
GFR calc non Af Amer: 44 mL/min — ABNORMAL LOW (ref >90)

## 2011-05-08 LAB — LIPID PANEL
Cholesterol: 119 mg/dL (ref 0–200)
HDL: 62 mg/dL (ref >39)
Total CHOL/HDL Ratio: 1.9 RATIO
Triglycerides: 83 mg/dL (ref <150)
VLDL: 17 mg/dL (ref 0–40)

## 2011-05-08 LAB — T4, FREE: Free T4: 1.14 ng/dL (ref 0.80–1.80)

## 2011-05-08 LAB — URINE MICROSCOPIC-ADD ON

## 2011-05-08 MED ORDER — REGADENOSON 0.4 MG/5ML IV SOLN
0.4000 mg | Freq: Once | INTRAVENOUS | Status: AC
  Administered 2011-05-08: 0.4 mg via INTRAVENOUS
  Filled 2011-05-08: qty 5

## 2011-05-08 MED ORDER — TECHNETIUM TC 99M TETROFOSMIN IV KIT
10.0000 | PACK | Freq: Once | INTRAVENOUS | Status: AC | PRN
  Administered 2011-05-08: 10 via INTRAVENOUS

## 2011-05-08 MED ORDER — TECHNETIUM TC 99M TETROFOSMIN IV KIT
30.0000 | PACK | Freq: Once | INTRAVENOUS | Status: AC | PRN
  Administered 2011-05-08: 30 via INTRAVENOUS

## 2011-05-08 NOTE — Progress Notes (Signed)
OT Cancellation Note  Treatment cancelled today due to patient receiving procedure or test.  OT checked on pt several times this morning and pt in testing.  Will recheck on pt next day.  [name] 05/08/2011, 2:31 PM

## 2011-05-08 NOTE — Progress Notes (Signed)
   CARE MANAGEMENT NOTE 05/08/2011  Patient:  Tammy [name], Tammy [name]   Account Number:  [id number]  Date Initiated:  05/08/2011  Documentation initiated by:  Letha Cape  Subjective/Objective Assessment:   dx chest pain  admit-lives with friend.     Action/Plan:   stress test 05/08/11  if neg dc home  pt/ot eval- no pt or ot needs.   Anticipated DC Date:  05/09/2011   Anticipated DC Plan:  HOME/SELF CARE      DC Planning Services  CM consult      Choice offered to / List presented to:             Status of service:  In process, will continue to follow Medicare Important Message given?   (If response is "NO", the following Medicare IM given date fields will be blank) Date Medicare IM given:   Date Additional Medicare IM given:    Discharge Disposition:    Per UR Regulation:    Comments:  PCP Collier Salina   05/08/11 10:39 Letha Cape RN, BSN [phone] Patient lives alone, admitted with chest tightness, patient fro stress test today, if negative will dc to home. Patient is having stress test done now.  NCM will continue to follow for dc needs.  Per physical therapy patient has no pt needs, she is at baseline.  Per MD patient has a questionable stress test, Cards to see, will not be dc today, maybe tomorrow.  Patient has medication coverage and transportation.

## 2011-05-08 NOTE — Evaluation (Signed)
Physical Therapy Evaluation Patient Details Name: Tammy [name] MRN: [id number] DOB: 05/14/1934 Today's Date: 05/08/2011  Problem List:  Patient Active Problem List  Diagnoses  . HYPERLIPIDEMIA  . ANEMIA, PERNICIOUS  . ANXIETY  . HYPERTENSION  . CAROTID ARTERY STENOSIS  . ALLERGIC RHINITIS  . PSORIASIS  . DEGENERATIVE JOINT DISEASE  . OSTEOPENIA  . CEREBROVASCULAR ACCIDENT, HX OF  . CAROTID ENDARTERECTOMY, RIGHT, HX OF  . General medical examination  . Synovial cyst  . Syncope  . Chest tightness or pressure  . Bradycardia  . Dizziness  . Near syncope    Past Medical History:  Past Medical History  Diagnosis Date  . CAD (coronary artery disease)     stress test 2003 "fix defect"  . Cerebrovascular accident 2000    Carotid endarterectomy,right-- due to trauma  . Hyperlipidemia   . Hypertension   . Allergic rhinitis     skin test 04/20/08  . Pneumonia     several pneumonias as a child, and otitis  . Salivary gland infection     s/p removal Dr Ezzard Standing  . Hemorrhoid   . Psoriasis 2010  . Osteopenia 4/10    DEXA,started fosamx  . Shingles 02/2010  . ANEMIA, PERNICIOUS 07/31/2006   Past Surgical History:  Past Surgical History  Procedure Date  . Cholecystectomy   . Tubal ligation   . Abdominal hysterectomy     no oophorectomy  . Salivary gland surgery     & LN resection 2002, Dr.Newman  . Carotid endarterectomy     right, 2000 due to trauma   . Appendectomy   . Spine surgery 12/10    extensive, decompression,allograft placement  . Shoulder surgery 11/2009    right, rotator cuff repiar, biciptial tendin repiar (Dr Debby Bud)    PT Assessment/Plan/Recommendation PT Assessment Clinical Impression Statement: pt is at or closing in on her baseline functioning PT Recommendation/Assessment: Patent does not need any further PT services No Skilled PT: Patient at baseline level of functioning;Patient is independent with all acitivity/mobility PT  Recommendation Equipment Recommended: None recommended by PT PT Goals     PT Evaluation Precautions/Restrictions    Prior Functioning  Home Living Lives With: Significant other Type of Home: House Home Layout: Able to live on main level with bedroom/bathroom;One level Home Access: Stairs to enter [employer] of Steps: 3 Home Adaptive Equipment: None Prior Function Level of Independence: Independent with basic ADLs;Independent with gait;Independent with transfers;Independent with homemaking with ambulation Able to Take Stairs?: Yes Driving: Yes Vocation: Part time employment Cognition Cognition Arousal/Alertness: Awake/alert Overall Cognitive Status: Appears within functional limits for tasks assessed Sensation/Coordination Sensation Light Touch: Appears Intact Coordination Gross Motor Movements are Fluid and Coordinated: Yes Fine Motor Movements are Fluid and Coordinated: Yes Extremity Assessment RUE Assessment RUE Assessment: Within Functional Limits LUE Assessment LUE Assessment: Within Functional Limits RLE Assessment RLE Assessment: Within Functional Limits LLE Assessment LLE Assessment: Within Functional Limits Mobility (including Balance) Bed Mobility Bed Mobility: Yes Supine to Sit: 7: Independent Transfers Transfers: Yes Sit to Stand: 7: Independent Ambulation/Gait Ambulation/Gait: Yes Ambulation/Gait Assistance: 7: Independent Ambulation Distance (Feet): 240 Feet Assistive device: None Gait Pattern: Within Functional Limits Stairs: Yes Stairs Assistance: 7: Independent Stair Management Technique: No rails;Forwards;Alternating pattern Number of Stairs: 4  (limited by line only)  Balance Balance Assessed: No Exercise    End of Session PT - End of Session Activity Tolerance: Patient tolerated treatment well Patient left: Other (comment) (left in the restroom) General Behavior During Session: [facility]  for tasks performed Cognition: [facility] for  tasks performed  Andelyn Spade, Eliseo Gum 05/08/2011, 2:59 PM  05/08/2011  Clarendon Bing, PT [phone] (pager)

## 2011-05-08 NOTE — Progress Notes (Signed)
Utilization review completed.  

## 2011-05-08 NOTE — Progress Notes (Signed)
  Echocardiogram 2D Echocardiogram has been performed.  Tammy [name] 05/08/2011, 1:43 PM

## 2011-05-08 NOTE — Consult Note (Signed)
CARDIOLOGY CONSULT NOTE   Tammy [name] MRN: [id number] DOB/AGE: 75-Aug-1936 75 y.o. Admit date: 05/07/2011  Referring Physician:  Dr. Ramiro Harvest, [facility] Team 2 Primary Physician: Dr. Drue Novel  Primary Cardiologist: Dr. Valera Castle  Reason for consultation:  Chest pain, bradycardia  HPI:  Tammy [name] is a 77 yo woman prior smoker with hx of HTN, HLD, fam hx CAD, who presents with exertional chest pain for the past 2 weeks.  Tammy [name] reports that she was recently started on coreg for HTN.  She was changed from her prior BP meds due to cost concerns and concern for LE edema as a side effect from one of her meds.  About 2 weeks ago, she was changed to a generic ARB and placed on coreg.  Since that time, Tammy [name] cannot walk more than 50 ft without feeling like there is a band like pressure around her chest assoc with SOB.  She reports that she was previously very active without limitations prior to this.  She monitors her BPs at home and has noticed that her resting HR decreased from a baseline 60-70 to the 40s.  Her lowest heart rate was 41, but never went lower than that.  She reports that she was also occ LH and dizzy throughout the same time frame, but denies syncope.  Regarding her prior stress test - this was done in 2003 after an episode of syncope.  She was told at an OSH that she had an MI, but when she saw Dr. Daleen Squibb in consultation, he determined that she never had a cardiac event.  Tammy [name] describes having a treadmill stress echo done and this is what is noted in Dr. Vern Claude clinic notes.        Review of systems: See above No n, v, d No bleeding No syncope No rash +numbess in arms one day last week No muscle cramps No abd pain, diarrhea No seizure No focal weakness No cough  Past Medical History  Diagnosis Date  . CAD (coronary artery disease)     stress test 2003 "fix defect"  . Cerebrovascular accident 2000    Carotid endarterectomy,right-- due to trauma  . Hyperlipidemia   .  Hypertension   . Allergic rhinitis     skin test 04/20/08  . Pneumonia     several pneumonias as a child, and otitis  . Salivary gland infection     s/p removal Dr Ezzard Standing  . Hemorrhoid   . Psoriasis 2010  . Osteopenia 4/10    DEXA,started fosamx  . Shingles 02/2010  . ANEMIA, PERNICIOUS 07/31/2006   Past Surgical History  Procedure Date  . Cholecystectomy   . Tubal ligation   . Abdominal hysterectomy     no oophorectomy  . Salivary gland surgery     & LN resection 2002, Dr.Newman  . Carotid endarterectomy     right, 2000 due to trauma   . Appendectomy   . Spine surgery 12/10    extensive, decompression,allograft placement  . Shoulder surgery 11/2009    right, rotator cuff repiar, biciptial tendin repiar (Dr Debby Bud)   History   Social History  . Marital Status: Divorced    Spouse Name: N/A    Number of Children: 3  . Years of Education: N/A   Occupational History  . [employer] Greeter-stands in doorway    Social History Main Topics  . Smoking status: Former Smoker - PRIOR 20 PACK YEAR HX    Types: Cigarettes    Quit  date: 11/20/1992  . Smokeless tobacco: Never Used  . Alcohol Use: 0.5 oz/week    1 drink(s) per week  . Drug Use: No  . Sexually Active: Not on file   Other Topics Concern  . Not on file   Social History Narrative  . No narrative on file    Family History  Problem Relation Age of Onset  . Hypertension Mother   . Hyperlipidemia Mother   . Colon cancer Mother   . Coronary artery disease Father   . Parkinsonism Father   . Heart disease Father   . Coronary artery disease Brother 60  . Breast cancer Neg Hx    Brother with MI and CABG age 34, Father with CABG   Allergies  Allergen Reactions  . Amoxicillin     REACTION: sick on stomach  . Clarithromycin     REACTION: sick on stomach  . Codeine     REACTION: "knocks me out"  . Cyclobenzaprine Hcl     REACTION: "paralysis"  . Hydrocodone     REACTION:"knocks me out"  . Morphine Sulfate      REACTION: "stop my heart"  . Percocet (Oxycodone-Acetaminophen) Nausea Only  . Tramadol Hcl     REACTION: nausea    Prescriptions prior to admission  Medication Sig Dispense Refill  . aspirin 81 MG tablet Take 81 mg by mouth at bedtime.       . carvedilol (COREG) 12.5 MG tablet Take 12.5 mg by mouth 2 (two) times daily with a meal.      . clobetasol cream (TEMOVATE) 0.05 % Apply 1 application topically 2 (two) times daily as needed. For psoriasis      . clonazePAM (KLONOPIN) 1 MG tablet Take 0.5 mg by mouth 3 (three) times daily as needed. for anxiety/insomnia.      . cyanocobalamin (,VITAMIN B-12,) 1000 MCG/ML injection Inject 1,000 mcg into the muscle every 30 (thirty) days.      . fluticasone (FLONASE) 50 MCG/ACT nasal spray Place 2 sprays into the nose daily as needed. For congestion      . hydrochlorothiazide (HYDRODIURIL) 25 MG tablet Take 12.5 mg by mouth at bedtime.      Marland Kitchen losartan (COZAAR) 100 MG tablet Take 100 mg by mouth at bedtime.      . rosuvastatin (CRESTOR) 40 MG tablet Take 40 mg by mouth at bedtime.        Current facility-administered medications:0.9 %  sodium chloride infusion, , Intravenous, Continuous, Ramiro Harvest, MD;  acetaminophen (TYLENOL) suppository 650 mg, 650 mg, Rectal, Q6H PRN, Ramiro Harvest, MD;  acetaminophen (TYLENOL) tablet 650 mg, 650 mg, Oral, Q6H PRN, Ramiro Harvest, MD;  alum & mag hydroxide-simeth (MAALOX/MYLANTA) 200-200-20 MG/5ML suspension 30 mL, 30 mL, Oral, Q6H PRN, Ramiro Harvest, MD aspirin EC tablet 81 mg, 81 mg, Oral, QHS, Nishant Dhungel, MD;  atorvastatin (LIPITOR) tablet 80 mg, 80 mg, Oral, q1800, Ramiro Harvest, MD;  clobetasol cream (TEMOVATE) 0.05 % 1 application, 1 application, Topical, BID PRN, Ramiro Harvest, MD;  clonazePAM Scarlette Calico) tablet 0.5 mg, 0.5 mg, Oral, TID PRN, Ramiro Harvest, MD;  cyanocobalamin ((VITAMIN B-12)) injection 1,000 mcg, 1,000 mcg, Intramuscular, Q30 days, Ramiro Harvest, MD enoxaparin (LOVENOX)  injection 40 mg, 40 mg, Subcutaneous, Q24H, Ramiro Harvest, MD;  fluticasone [facility]) 50 MCG/ACT nasal spray 2 spray, 2 spray, Each Nare, Daily PRN, Ramiro Harvest, MD;  hydrALAZINE (APRESOLINE) tablet 25 mg, 25 mg, Oral, Q8H, Ramiro Harvest, MD;  hydrochlorothiazide (HYDRODIURIL) tablet 12.5 mg, 12.5 mg, Oral, QHS,  Ramiro Harvest, MD;  ibuprofen (ADVIL,MOTRIN) tablet 600 mg, 600 mg, Oral, Q6H PRN, Ramiro Harvest, MD losartan (COZAAR) tablet 100 mg, 100 mg, Oral, QHS, Ramiro Harvest, MD;  nitroGLYCERIN (NITROSTAT) SL tablet 0.3 mg, 0.3 mg, Sublingual, Q5 min PRN, Ramiro Harvest, MD;  ondansetron [facility]) injection 4 mg, 4 mg, Intravenous, Q6H PRN, Ramiro Harvest, MD;  ondansetron [facility]) tablet 4 mg, 4 mg, Oral, Q6H PRN, Ramiro Harvest, MD;  pantoprazole (PROTONIX) EC tablet 40 mg, 40 mg, Oral, Q0600, Ramiro Harvest, MD senna-docusate (Senokot-S) tablet 1 tablet, 1 tablet, Oral, QHS PRN, Ramiro Harvest, MD;  sodium chloride 0.9 % injection 3 mL, 3 mL, Intravenous, Q12H, Ramiro Harvest, MD;  DISCONTD: aspirin tablet 81 mg, 81 mg, Oral, QHS, Ramiro Harvest, MD Facility-Administered Medications Ordered in Other Encounters: 0.9 %  sodium chloride infusion, , Intravenous, Once, Barkley Bruns, MD, Last Rate: 20 mL/hr at 05/07/11 1826  Physical Exam: Blood pressure 172/73, pulse 47, temperature 97.7 F (36.5 C), temperature source Oral, resp. rate 20, SpO2 97.00%.; There is no height or weight on file to calculate BMI. Temp:  [97.7 F (36.5 C)-98.2 F (36.8 C)] 97.7 F (36.5 C) (03/04 1909) Pulse Rate:  [46-58] 47  (03/04 2339) Resp:  [18-20] 20  (03/04 2100) BP: (133-172)/(54-75) 172/73 mmHg (03/04 2339) SpO2:  [96 %-98 %] 97 % (03/04 2100)  No intake or output data in the 24 hours ending 05/08/11 0007 General: NAD Heent: MMM Neck: No JVD  CV: Nondisplaced PMI.  RRR, nl S1/S2, no S3/S4, no murmur  Lungs: Clear to auscultation bilaterally with normal respiratory effort Abdomen:  Soft, nontender, nondistended Extremities: No clubbing or cyanosis.  No pedal edema Skin: Intact without lesions or rashes  Neurologic: Alert and oriented x 3, grossly nonfocal  Psych: Normal mood and affect    Labs: No results found for this basename: CKTOTAL:4,CKMB:4,TROPONINI:4 in the last 72 hours Lab Results  Component Value Date   WBC 7.4 05/07/2011   HGB 13.0 05/07/2011   HCT 40.4 05/07/2011   MCV 89.0 05/07/2011   PLT PLATELET CLUMPS NOTED ON SMEAR, COUNT APPEARS ADEQUATE 05/07/2011    Lab 05/07/11 1903  NA 139  K 4.3  CL 101  CO2 23  BUN 27*  CREATININE 1.21*  CALCIUM 10.3  PROT --  BILITOT --  ALKPHOS --  ALT --  AST --  GLUCOSE 87   Lab Results  Component Value Date   CHOL 150 02/23/2011   HDL 79.60 02/23/2011   LDLCALC 55 02/23/2011   TRIG 75.0 02/23/2011   EKG:   05/07/11 1746 - sinus brady rate 59, sinus arrhythmia, no acute ischemic changes 05/07/11 1905 - sinus brady rate 40, sinus arrhythmia, no acute ischemic changes  Radiology:  Dg Chest Port 1 View  05/07/2011  *RADIOLOGY REPORT*  Clinical Data: Shortness of breath.  Dyspnea.  PORTABLE CHEST - 1 VIEW  Comparison: 02/13/2012  Findings: 2005 hours. The lungs are clear without focal infiltrate, edema, pneumothorax or pleural effusion. Interstitial markings are diffusely coarsened with chronic features. Cardiopericardial silhouette is at upper limits of normal for size. Telemetry leads overlie the chest.  IMPRESSION: No evidence for focal consolidation nor overt pulmonary edema.  Original Report Authenticated By: ERIC A. MANSELL, M.D.    ASSESSMENT:  Tammy [name] is a 77 yo woman prior smoker with hx of HTN, HLD, fam hx CAD, who presents with chest pain and bradycardia after recent initiation of coreg for HTN management.  PLAN: Chest pain - Tammy [name]'s sx are exertional  in nature and sound consistent with new onset angina (which could be induced by extreme bradycardia and associated chronotropic incompetence vs due to CAD - Tammy [name]'s  risk factors include HTN, HLD, age, prior smoker, fam hx).  EKG with no acute ischemic changes, initial cardiac enzymes negative.  Agree with holding coreg and checking echo and trending cardiac enzymes.  Please monitor on tele and make NPO p MN for stress test in am.  Nuclear stress test ordered (given bradycardia and coreg, would be hard to get Tammy [name] to target HR for exercise treadmill echo or for DSE).  Fasting lipids pending for AM, though last LDL was at goal.          Bradycardia - this is likely iatrogenic given recent addition of coreg to her medication regimen.  Agree with stopping this medication.  Bradycardia is likely contributing to her sx as above.  Monitor on tele.  Agree with checking TSH.     Thank you for this consultation.  Will continue to follow.  Signed: Hilary Hertz, MD Cardiology Fellow 05/08/2011, 12:07 AM

## 2011-05-08 NOTE — Telephone Encounter (Signed)
Pt was admitted to the hospital w/ bradycardia, CP ; seen by cards, will f/u once she is d/c home

## 2011-05-08 NOTE — Progress Notes (Signed)
Subjective: Patient seen and examined after returning from stress test. Has some chest soreness with ambulation. Stable not tele today  with HR better   Objective:  Vital signs in last 24 hours:  Filed Vitals:   05/08/11 1031 05/08/11 1033 05/08/11 1300 05/08/11 1350  BP: 114/52 127/54 148/75 148/75  Pulse: 69 64  58  Temp:    97.5 F (36.4 C)  TempSrc:    Oral  Resp:    20  Height:      Weight:      SpO2:    98%    Intake/Output from previous day:  No intake or output data in the 24 hours ending 05/08/11 1632  Physical Exam:  General: elderly female  in no acute distress. HEENT: no pallor, no icterus, moist oral mucosa, no JVD, no lymphadenopathy Heart: Normal  s1 &s2  Regular rate and rhythm, without murmurs, rubs, gallops. Lungs: Clear to auscultation bilaterally. Abdomen: Soft, nontender, nondistended, positive bowel sounds. Extremities: No clubbing cyanosis or edema with positive pedal pulses. Neuro: Alert, awake, oriented x3, nonfocal.   Lab Results:  Basic Metabolic Panel:    Component Value Date/Time   NA 144 05/08/2011 0600   K 3.5 05/08/2011 0600   CL 108 05/08/2011 0600   CO2 26 05/08/2011 0600   BUN 23 05/08/2011 0600   CREATININE 1.17* 05/08/2011 0600   CREATININE 1.40* 04/20/2011 1522   GLUCOSE 77 05/08/2011 0600   GLUCOSE 87 02/19/2006 1207   CALCIUM 9.2 05/08/2011 0600   CBC:    Component Value Date/Time   WBC 5.8 05/08/2011 0600   HGB 11.2* 05/08/2011 0600   HCT 35.9* 05/08/2011 0600   PLT 127* 05/08/2011 0600   MCV 89.1 05/08/2011 0600   NEUTROABS 3.3 05/07/2011 1903   LYMPHSABS 2.4 05/07/2011 1903   MONOABS 0.6 05/07/2011 1903   EOSABS 0.3 05/07/2011 1903   BASOSABS 0.0 05/07/2011 1903    No results found for this or any previous visit (from the past 240 hour(s)).  Studies/Results: Nm Myocar Multi W/spect W/wall Motion / Ef  05/08/2011  77 year old female with hypertension, hyperlipidemia and a positive family history with chest pain. This study is performed to  exclude ischemia.  This is a same day rest stress protocol.  30 mCi of Myoview were used for the stress images and 10 mCi of Myoview were used for the rest images.  Lexiscan was administered in the typical fashion.  The patients resting heart rate was 55 and following infusion 68. The blood pressure at rest was 152/62 and following infusion 127/54.  There was no chest pain during the study.  There were no ST changes.  The study was terminated per protocol.  Scintigraphic results: the images were reconstructed in the short axis as well as the vertical and horizontal long axis.  The stress images reveal a small defect in the distal anterior wall and apex. When compared to the rest images there is partial reversibility in that distribution.  The gated ejection fraction was 69% and the wall motion was normal.  T I D - 1.04.  End-diastolic volume 81 ml. End-systolic volume 26 ml.  Final interpretation: Lexiscan Myoview with no diagnostic electrocardiographic changes.  The scintigraphic results show soft tissue attenuation and mild ischemia in the distal anterior wall and apex.  The gated ejection fraction was 69% and the wall motion was normal.  Original Report Authenticated By: Bobbie Stack Chest Port 1 View  05/07/2011  *RADIOLOGY REPORT*  Clinical Data:  Shortness of breath.  Dyspnea.  PORTABLE CHEST - 1 VIEW  Comparison: 02/13/2012  Findings: 2005 hours. The lungs are clear without focal infiltrate, edema, pneumothorax or pleural effusion. Interstitial markings are diffusely coarsened with chronic features. Cardiopericardial silhouette is at upper limits of normal for size. Telemetry leads overlie the chest.  IMPRESSION: No evidence for focal consolidation nor overt pulmonary edema.  Original Report Authenticated By: ERIC A. MANSELL, M.D.    Medications: Scheduled Meds:   . aspirin EC  81 mg Oral QHS  . atorvastatin  80 mg Oral q1800  . cyanocobalamin  1,000 mcg Intramuscular Q30 days  . enoxaparin  40 mg  Subcutaneous Q24H  . hydrALAZINE  25 mg Oral Q8H  . hydrochlorothiazide  12.5 mg Oral QHS  . losartan  100 mg Oral QHS  . pantoprazole  40 mg Oral Q0600  . regadenoson  0.4 mg Intravenous Once  . sodium chloride  3 mL Intravenous Q12H  . DISCONTD: aspirin  81 mg Oral QHS   Continuous Infusions:   . sodium chloride 75 mL/hr at 05/08/11 0140   PRN Meds:.acetaminophen, acetaminophen, alum & mag hydroxide-simeth, clobetasol cream, clonazePAM, fluticasone, ibuprofen, nitroGLYCERIN, ondansetron (ZOFRAN) IV, ondansetron, senna-docusate, technetium tetrofosmin, technetium tetrofosmin  Assessment/  77 year old Caucasian female history of coronary artery disease, hyperlipidemia, hypertension, prior CVA, status post right carotid endarterectomy secondary to trauma, history of anemia who presents to the ED with a 2 week history of dizziness, bradycardia, generalized weakness, dyspnea on exertion , and the chest heaviness on exertion.   Plan: #1 chest tightness/ chest pressure  Aggravated with exertion concerning for new onset angina.  -associated with her weakness and dizziness as well.  Patient admitted to tele. serial CE negative. She was brady to 40s on admission and  she is recently started on coreg  cont ASA, sl nitro. Will hold coreg and monitor  seen by cardiology and stress test done this morning shows mild ischemia in the distal anterior wall  and apex.  will follow up with cardiology  #2 dizziness/weakness/near syncope/bradycardia  Likely medication induced as patient developed these symptoms after she was  placed on coreg.   Cont to hold coreg and gentle hydration  2D echo pending She does have elevated TSH , will check free T4  #3 hyperlipidemia   Continue home dose statin.   #4 hypertension  Cont to hold coreg Cont hydralazine  #5 history of CVA  Stable. Continue aspirin.   #6 history of pernicious anemia  H&H is stable.   DVT prophylaxis Sq lovenox         LOS: 1 day   Brena Windsor 05/08/2011, 4:32 PM

## 2011-05-09 ENCOUNTER — Encounter (HOSPITAL_COMMUNITY)
Admission: EM | Disposition: A | Discharge status: Home / Self Care | Payer: Self-pay | Source: Ambulatory Visit | Attending: Internal Medicine

## 2011-05-09 DIAGNOSIS — R079 Chest pain, unspecified: Secondary | ICD-10-CM

## 2011-05-09 DIAGNOSIS — I498 Other specified cardiac arrhythmias: Secondary | ICD-10-CM

## 2011-05-09 HISTORY — PX: LEFT HEART CATHETERIZATION WITH CORONARY ANGIOGRAM: SHX5451

## 2011-05-09 HISTORY — PX: CARDIAC CATHETERIZATION: SHX172

## 2011-05-09 LAB — URINE CULTURE

## 2011-05-09 SURGERY — LEFT HEART CATHETERIZATION WITH CORONARY ANGIOGRAM
Anesthesia: LOCAL

## 2011-05-09 MED ORDER — HEPARIN SODIUM (PORCINE) 1000 UNIT/ML IJ SOLN
INTRAMUSCULAR | Status: AC
  Filled 2011-05-09: qty 1

## 2011-05-09 MED ORDER — DIPHENHYDRAMINE HCL 50 MG/ML IJ SOLN
INTRAMUSCULAR | Status: AC
  Filled 2011-05-09: qty 1

## 2011-05-09 MED ORDER — HYDROCHLOROTHIAZIDE 12.5 MG PO CAPS
12.5000 mg | ORAL_CAPSULE | Freq: Every day | ORAL | Status: DC
  Administered 2011-05-09 – 2011-05-10 (×2): 12.5 mg via ORAL
  Filled 2011-05-09 (×2): qty 1

## 2011-05-09 MED ORDER — SODIUM CHLORIDE 0.9 % IV SOLN: 1.0000 mL/kg/h | INTRAVENOUS | Status: DC

## 2011-05-09 MED ORDER — VERAPAMIL HCL 2.5 MG/ML IV SOLN
INTRAVENOUS | Status: AC
  Filled 2011-05-09: qty 2

## 2011-05-09 MED ORDER — ASPIRIN EC 81 MG PO TBEC
81.0000 mg | DELAYED_RELEASE_TABLET | Freq: Every day | ORAL | Status: DC
  Filled 2011-05-09: qty 1

## 2011-05-09 MED ORDER — METHYLPREDNISOLONE SODIUM SUCC 125 MG IJ SOLR
80.0000 mg | Freq: Once | INTRAMUSCULAR | Status: AC
  Administered 2011-05-09: 80 mg via INTRAVENOUS
  Filled 2011-05-09: qty 2

## 2011-05-09 MED ORDER — FAMOTIDINE IN NACL 20-0.9 MG/50ML-% IV SOLN
INTRAVENOUS | Status: AC
  Filled 2011-05-09: qty 50

## 2011-05-09 MED ORDER — NITROGLYCERIN 0.2 MG/ML ON CALL CATH LAB
INTRAVENOUS | Status: AC
  Filled 2011-05-09: qty 1

## 2011-05-09 MED ORDER — MIDAZOLAM HCL 2 MG/2ML IJ SOLN
INTRAMUSCULAR | Status: AC
  Filled 2011-05-09: qty 2

## 2011-05-09 MED ORDER — SODIUM CHLORIDE 0.9 % IV SOLN: INTRAVENOUS | Status: AC

## 2011-05-09 MED ORDER — ASPIRIN 81 MG PO CHEW
324.0000 mg | CHEWABLE_TABLET | ORAL | Status: AC
  Administered 2011-05-09: 324 mg via ORAL
  Filled 2011-05-09: qty 4

## 2011-05-09 MED ORDER — DIAZEPAM 5 MG PO TABS
5.0000 mg | ORAL_TABLET | ORAL | Status: AC
  Administered 2011-05-09: 5 mg via ORAL
  Filled 2011-05-09: qty 1

## 2011-05-09 MED ORDER — HEPARIN (PORCINE) IN NACL 2-0.9 UNIT/ML-% IJ SOLN
INTRAMUSCULAR | Status: AC
  Filled 2011-05-09: qty 2000

## 2011-05-09 MED ORDER — LIDOCAINE HCL (PF) 1 % IJ SOLN
INTRAMUSCULAR | Status: AC
  Filled 2011-05-09: qty 30

## 2011-05-09 NOTE — Progress Notes (Signed)
Informed by pt that she has a contrast allergy - this was not listed in her allergies and pt made aware of this.  I have added this as an allergy.  Spoke with Shanda Bumps, [place] with [facility] in regards to this issue.  Informed patient that her cath may be postponed until tomorrow due to pre-medication requirements.  Waiting for update from PA.

## 2011-05-09 NOTE — H&P (View-Only) (Signed)
  Subjective:    Patient ID: Tammy [name], female    DOB: Aug 19, 1934, 77 y.o.   MRN: [id number]  HPI Hospital followup Was seen at the ER  04/16/2011 for a syncope that happened 04-15-11, felt to be Vaso vagal. Chart reviewed Vital signs were stable, O2 sat normal, creatinine 1.4, slightly above baseline. CBC and chest x-ray negative. EKG reportedly okay.  Past Medical History:  Coronary artery disease, stress test 2004 "fix defect"  Cerebrovascular accident 2000, CAROTID ENDARTERECTOMY, RIGHT, -- due to trauma  Hyperlipidemia  Hypertension  Allergic rhinitis- Skin test 04/20/08  Recurrent rhinosinusitis  Several pneumonias and otitis as a child.  Salivary gland infection, s/p removal Dr Ezzard Standing  Hemorrhoid  h/o pernicious anemia  Hemorrhoid  psoriasis (2010)  osteopenia, DEXA 4-10 , started Fosamax  Past Surgical History:  Cholecystectomy  tubal ligation  Hysterectomy, no oophorectomy  Salivary gland & LN resection 2002 ,Dr Ezzard Standing  CAROTID ENDARTERECTOMY, RIGHT 2,000 -- due to trauma  Appendectomy  12-10 extensive cervical spine surgery, decompression, allograft placement  11-2009 R shoulder surgery, rotator cuff repair, bicipital tendin repair (Dr Debby Bud)   Review of Systems  patient described the syncope as follows: She was eating breakfast at work, suddenly she felt nauseous, she has some epigastric discomfort described as gas, palpitations and started to sweat. Then she lost consciousness for about 30 seconds, it was witnessed by coworkers, no reported seizure, bladder or bowel incontinence. When she woke up she was slightly confused and had some tingling to her hands. Throughout these time, there was no chest pain, slurred speech, diplopia, motor deficits. She did have a mild headache, " like a band around the head". On further questioning, she was prescribed amlodipine in December 2012, about 4 weeks ago she noted increase in the lower extremity edema on self increase  her diuretic to one full tablet daily and sometimes 2 tablets daily. Her blood pressure has been great. Since the ER evaluation, she had no further symptoms.     Objective:   Physical Exam  Constitutional: She is oriented to person, place, and time. She appears well-developed and well-nourished. No distress.  Neck:       Nl carotid pulse  Cardiovascular: Normal rate, regular rhythm and normal heart sounds.   No murmur heard. Pulmonary/Chest: Effort normal and breath sounds normal. No respiratory distress. She has no wheezes. She has no rales.  Musculoskeletal: She exhibits edema (+/+++ pitting edema B, symmetric ).  Neurological: She is alert and oriented to person, place, and time.       Speech, gait, motor intact  Skin: She is not diaphoretic.  Psychiatric: She has a normal mood and affect. Her behavior is normal. Judgment and thought content normal.      Assessment & Plan:

## 2011-05-09 NOTE — Interval H&P Note (Signed)
History and Physical Interval Note:  05/09/2011 5:48 PM  Tammy [name]  has presented today for surgery, with the diagnosis of chest pain  The various methods of treatment have been discussed with the patient. After consideration of risks, benefits and other options for treatment, the patient has consented to  Procedure(s) (LRB): LEFT HEART CATHETERIZATION WITH CORONARY ANGIOGRAM (N/A) as a surgical intervention .  The patients' history has been reviewed, patient examined, no change in status, stable for surgery.  I have reviewed the patients' chart and labs.  Questions were answered to the patient's satisfaction.   Chest pain with abnormal stress test which showed anterior ischemia.    Lorine Bears

## 2011-05-09 NOTE — Progress Notes (Signed)
Patient ID: Tammy [name], female   DOB: May 05, 1934, 77 y.o.   MRN: [id number] No complaints this am. Nuclear stress reveals anterior [name] ischemia. Bradycardia resolved off carvedilol. Bp is stable. Exam stable.  Plan cath this afternoon. Will give clear liquids. Indications, risk, potential outcome discussed. She agrees to proceed.

## 2011-05-09 NOTE — Progress Notes (Signed)
OT Note:  Brief contact made with pt as she was returning from independently using the restroom.  Pt reports being independent in ADL and mobility.  Did not proceed with eval.  Signing off. 05/09/2011 Martie Round, OTR/L Pager: [phone]

## 2011-05-09 NOTE — Progress Notes (Signed)
Subjective: For cath this afternoon.  No CP, no SOB, nervous about dye  Objective: Vital signs in last 24 hours: Filed Vitals:   05/08/11 1300 05/08/11 1350 05/08/11 2100 05/09/11 0500  BP: 148/75 148/75 112/70 134/56  Pulse:  58 64 63  Temp:  97.5 F (36.4 C) 97.9 F (36.6 C) 98 F (36.7 C)  TempSrc:  Oral Oral Oral  Resp:  20 20 18   Height:      Weight:    72.9 kg (160 lb 11.5 oz)  SpO2:  98% 95% 93%   Weight change: 3.7 kg (8 lb 2.5 oz)  Intake/Output Summary (Last 24 hours) at 05/09/11 1210 Last data filed at 05/08/11 1700  Gross per 24 hour  Intake    360 ml  Output      0 ml  Net    360 ml    Physical Exam: General: Awake, Oriented, No acute distress. HEENT: EOMI. Neck: Supple CV: S1 and S2, rrr Lungs: Clear to ascultation bilaterally, no wheezing Abdomen: Soft, Nontender, Nondistended, +bowel sounds. Ext: Good pulses. Trace edema.   Lab Results:  Basename 05/08/11 0600 05/07/11 2315 05/07/11 1903  NA 144 -- 139  K 3.5 -- 4.3  CL 108 -- 101  CO2 26 -- 23  GLUCOSE 77 -- 87  BUN 23 -- 27*  CREATININE 1.17* 1.19* --  CALCIUM 9.2 -- 10.3  MG -- 2.0 --  PHOS -- -- --    Basename 05/07/11 2315  AST 34  ALT 44*  ALKPHOS 89  BILITOT 0.7  PROT 7.1  ALBUMIN 4.0   No results found for this basename: LIPASE:2,AMYLASE:2 in the last 72 hours  Basename 05/08/11 0600 05/07/11 2315 05/07/11 1903  WBC 5.8 7.4 --  NEUTROABS -- -- 3.3  HGB 11.2* 13.0 --  HCT 35.9* 40.4 --  MCV 89.1 89.0 --  PLT 127* PLATELET CLUMPS NOTED ON SMEAR, COUNT APPEARS ADEQUATE --    Basename 05/08/11 1450 05/08/11 0600 05/07/11 2315  CKTOTAL 127 132 197*  CKMB 2.9 2.8 3.3  CKMBINDEX -- -- --  TROPONINI <0.30 <0.30 <0.30   No components found with this basename: POCBNP:3 No results found for this basename: DDIMER:2 in the last 72 hours No results found for this basename: HGBA1C:2 in the last 72 hours  Basename 05/08/11 0600  CHOL 119  HDL 62  LDLCALC 40  TRIG 83    CHOLHDL 1.9  LDLDIRECT --    Basename 05/07/11 2315  TSH 5.839*  T4TOTAL --  T3FREE --  THYROIDAB --   No results found for this basename: VITAMINB12:2,FOLATE:2,FERRITIN:2,TIBC:2,IRON:2,RETICCTPCT:2 in the last 72 hours  Micro Results: No results found for this or any previous visit (from the past 240 hour(s)).  Studies/Results: Nm Myocar Multi W/spect W/wall Motion / Ef  05/08/2011  77 year old female with hypertension, hyperlipidemia and a positive family history with chest pain. This study is performed to exclude ischemia.  This is a same day rest stress protocol.  30 mCi of Myoview were used for the stress images and 10 mCi of Myoview were used for the rest images.  Lexiscan was administered in the typical fashion.  The patients resting heart rate was 55 and following infusion 68. The blood pressure at rest was 152/62 and following infusion 127/54.  There was no chest pain during the study.  There were no ST changes.  The study was terminated per protocol.  Scintigraphic results: the images were reconstructed in the short axis as well as the vertical  and horizontal long axis.  The stress images reveal a small defect in the distal anterior wall and apex. When compared to the rest images there is partial reversibility in that distribution.  The gated ejection fraction was 69% and the wall motion was normal.  T I D - 1.04.  End-diastolic volume 81 ml. End-systolic volume 26 ml.  Final interpretation: Lexiscan Myoview with no diagnostic electrocardiographic changes.  The scintigraphic results show soft tissue attenuation and mild ischemia in the distal anterior wall and apex.  The gated ejection fraction was 69% and the wall motion was normal.  Original Report Authenticated By: Bobbie Stack Chest Port 1 View  05/07/2011  *RADIOLOGY REPORT*  Clinical Data: Shortness of breath.  Dyspnea.  PORTABLE CHEST - 1 VIEW  Comparison: 02/13/2012  Findings: 2005 hours. The lungs are clear without focal  infiltrate, edema, pneumothorax or pleural effusion. Interstitial markings are diffusely coarsened with chronic features. Cardiopericardial silhouette is at upper limits of normal for size. Telemetry leads overlie the chest.  IMPRESSION: No evidence for focal consolidation nor overt pulmonary edema.  Original Report Authenticated By: ERIC A. MANSELL, M.D.    Medications: I have reviewed the patient's current medications. Scheduled Meds:   . aspirin  324 mg Oral Pre-Cath  . aspirin EC  81 mg Oral QHS  . atorvastatin  80 mg Oral q1800  . cyanocobalamin  1,000 mcg Intramuscular Q30 days  . diazepam  5 mg Oral On Call  . enoxaparin  40 mg Subcutaneous Q24H  . hydrALAZINE  25 mg Oral Q8H  . hydrochlorothiazide  12.5 mg Oral Daily  . losartan  100 mg Oral QHS  . pantoprazole  40 mg Oral Q0600  . sodium chloride  3 mL Intravenous Q12H  . DISCONTD: aspirin EC  81 mg Oral QHS  . DISCONTD: hydrochlorothiazide  12.5 mg Oral QHS   Continuous Infusions:   . sodium chloride 75 mL/hr at 05/09/11 0140  . sodium chloride     PRN Meds:.acetaminophen, acetaminophen, alum & mag hydroxide-simeth, clobetasol cream, clonazePAM, fluticasone, ibuprofen, nitroGLYCERIN, ondansetron (ZOFRAN) IV, ondansetron, senna-docusate  Assessment/Plan: #1 chest tightness/ chest pressure  Aggravated with exertion concerning for new onset angina.  -associated with her weakness and dizziness as well.  Patient admitted to tele. serial CE negative. She was brady to 40s on admission and she is recently started on coreg- resolved off coreg  cont ASA, sl nitro. Will hold coreg and monitor  seen by cardiology and stress test done this morning shows mild ischemia in the distal anterior wall  and apex.- plan for cath this PM    #2 dizziness/weakness/near syncope/bradycardia  Likely medication induced as patient developed these symptoms after she was placed on coreg.  Cont to hold coreg and gentle hydration  2D echo ok She  does have elevated TSH , will check free T4   #3 hyperlipidemia  Continue home dose statin.   #4 hypertension  Cont to hold coreg  Cont hydralazine   #5 history of CVA  Stable. Continue aspirin.   #6 history of pernicious anemia  H&H is stable.   DVT prophylaxis  Sq lovenox  Hope for discharge tomorrow    LOS: 2 days  Tammy Wescoat, DO 05/09/2011, 12:10 PM

## 2011-05-09 NOTE — CV Procedure (Signed)
   Cardiac Catheterization Procedure Note  Name: Tammy [name] MRN: [id number] DOB: Feb 09, 1935  Procedure: Left Heart Cath, Selective Coronary Angiography, LV angiography  Indication: Chest pain with abnormal stress test.  Medications:  Sedation:  1 mg IV Versed, 25 mcg IV Fentanyl  Contrast:  55 ml Omnipaque   Procedural Details: The right wrist was prepped, draped, and anesthetized with 1% lidocaine. Using the modified Seldinger technique, a 5 French sheath was introduced into the right radial artery. 3 mg of verapamil was administered through the sheath, weight-based unfractionated heparin was administered intravenously. A STANDARD JUDKINS catheters  were used for selective coronary angiography. A pigtail catheter was used for left ventriculography. Catheter exchanges were performed over an exchange length guidewire. There were no immediate procedural complications. A TR band was used for radial hemostasis at the completion of the procedure.  The patient was transferred to the post catheterization recovery area for further monitoring.  Procedural Findings:  Hemodynamics: AO:  154/68   mmHg LV:  154/14    mmHg LVEDP: 24  mmHg  Coronary angiography: Coronary dominance: Right   Left Main:  Normal in size and free of any significant disease.  Left Anterior Descending (LAD):  Normal in size with no significant carotid artery disease.  1st diagonal (D1):  Very large in size and gives to a medium-size branches that are free of significant disease. These supply the whole diagonal distribution.   Circumflex (LCx):  The veteran is normal in size and nondominant it has minor irregularities without evidence of obstructive disease.  1st obtuse marginal:  Very small in size.  2nd obtuse marginal:  Normal in size and free of significant disease.  3rd obtuse marginal:  Normal in size and free of significant disease.   Right Coronary Artery: The veteran is normal in size and dominant.  There is a mild 20% proximal stenosis but otherwise no evidence of obstructive coronary artery disease  posterior descending artery: Normal in size and free of significant disease.  posterior lateral branch:  Medium-size without obstructive disease.  Left ventriculography: Left ventricular systolic function is normal , LVEF is estimated at 60 %. Mitral regurgitation induced by PVCs.  Final Conclusions:   1. No evidence of obstructive coronary artery disease. 2. Normal LV systolic function. 3. Moderately elevated left ventricular end-diastolic pressure likely due to diastolic dysfunction.  Recommendations:  Medical therapy. The nuclear stress test was likely false-positive.  Lorine Bears MD, [facility] 05/09/2011, 6:24 PM

## 2011-05-10 DIAGNOSIS — R0789 Other chest pain: Principal | ICD-10-CM

## 2011-05-10 LAB — CBC
HCT: 34.2 % — ABNORMAL LOW (ref 36.0–46.0)
MCHC: 31.6 g/dL (ref 30.0–36.0)
MCV: 89.5 fL (ref 78.0–100.0)
RDW: 15.9 % — ABNORMAL HIGH (ref 11.5–15.5)

## 2011-05-10 LAB — BASIC METABOLIC PANEL
BUN: 22 mg/dL (ref 6–23)
Creatinine, Ser: 1.27 mg/dL — ABNORMAL HIGH (ref 0.50–1.10)
GFR calc Af Amer: 47 mL/min — ABNORMAL LOW (ref >90)
GFR calc non Af Amer: 41 mL/min — ABNORMAL LOW (ref >90)

## 2011-05-10 MED ORDER — HYDRALAZINE HCL 25 MG PO TABS: 25.0000 mg | ORAL_TABLET | Freq: Three times a day (TID) | ORAL | Status: DC

## 2011-05-10 NOTE — Discharge Summary (Addendum)
Discharge Summary  Tammy [name] MR#: [id number]  DOB:Mar 14, 1934  Date of Admission: 05/07/2011 Date of Discharge: 05/10/2011  Patient's PCP: Willow Ora, MD, MD  Attending Physician:Kathrin Folden  Consults: Treatment Team:  Hilary Hertz, MD [facility], MD   Discharge Diagnoses: Principal Problem:  *Chest tightness or pressure Active Problems:  HYPERLIPIDEMIA  ANEMIA, PERNICIOUS  HYPERTENSION  CEREBROVASCULAR ACCIDENT, HX OF  Bradycardia  Dizziness  Near syncope   Brief Admitting History and Physical Tammy [name] is a pleasant 77 year old Caucasian female history of coronary artery disease, hyperlipidemia, hypertension, prior CVA, status post right carotid endarterectomy secondary to trauma, history of anemia who presents to the ED with a 2 week history of dizziness, bradycardia, generalized weakness, feeling SOB washout, and the chest heaviness on exertion. Patient states that she was seen in the ED about 2 weeks prior to admission when she had passed out and was felt this was vasovagal in nature. Patient states that her blood pressure regimen was recently changed. Patient states that she was changed from Diovan to Coreg per PCP secondary to financial issues. Patient states that ever since she started taking the Coreg she's been feeling very dizzy, with some generalized weakness, bradycardic as well. 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 Patient denies any radiation of this chest heaviness. Patient denies any palpitations. Patient denies any recent syncope over the past one week, patient denies any paroxysmal nocturnal dyspnea, patient denies any orthopnea, no fever, no chills, no nausea, no vomiting, no diarrhea, no constipation, no dysuria. Patient does endorse some generalized weakness. Patient also endorses some lower extremity  edema whenever she is on her feet which improves when she lays down. Patient also with complaints of occasional numbness in her hands bilaterally. Patient was seen in the ED EKG done did show a sinus bradycardia with poor R-wave progression. First of troponin done was negative. Be met done had a BUN of 27 a creatinine of 1.21 otherwise was within normal limits. First set of troponin was 0.01. CBC done had a platelets of 130 otherwise was within normal limits. Will call to admit the patient for further evaluation and management.   Discharge Medications Medication List  As of 05/10/2011 12:25 PM   STOP taking these medications         carvedilol 12.5 MG tablet      hydrochlorothiazide 25 MG tablet         TAKE these medications         aspirin 81 MG tablet   Take 81 mg by mouth at bedtime.      clobetasol cream 0.05 %   Commonly known as: TEMOVATE   Apply 1 application topically 2 (two) times daily as needed. For psoriasis      clonazePAM 1 MG tablet   Commonly known as: KLONOPIN   Take 0.5 mg by mouth 3 (three) times daily as needed. for anxiety/insomnia.      cyanocobalamin 1000 MCG/ML injection   Commonly known as: (VITAMIN B-12)   Inject 1,000 mcg into the muscle every 30 (thirty) days.      fluticasone 50 MCG/ACT nasal spray   Commonly known as: FLONASE   Place 2 sprays into the nose daily as needed. For congestion      hydrALAZINE 25 MG tablet   Commonly known as: APRESOLINE   Take 1 tablet (25 mg total)  by mouth every 8 (eight) hours.      losartan 100 MG tablet   Commonly known as: COZAAR   Take 100 mg by mouth at bedtime.      rosuvastatin 40 MG tablet   Commonly known as: CRESTOR   Take 40 mg by mouth at bedtime.            Hospital Course: 1. Chest tightness- most like false positive stress test as cath results showed: Coronary angiography:  Coronary dominance: Right  Left Main: Normal in size and free of any significant disease.  Left Anterior  Descending (LAD): Normal in size with no significant carotid artery disease.  1st diagonal (D1): Very large in size and gives to a medium-size branches that are free of significant disease. These supply the whole diagonal distribution.  Circumflex (LCx): The veteran is normal in size and nondominant it has minor irregularities without evidence of obstructive disease.  1st obtuse marginal: Very small in size.  2nd obtuse marginal: Normal in size and free of significant disease.  3rd obtuse marginal: Normal in size and free of significant disease.  Right Coronary Artery: The veteran is normal in size and dominant. There is a mild 20% proximal stenosis but otherwise no evidence of obstructive coronary artery disease  posterior descending artery: Normal in size and free of significant disease.  posterior lateral branch: Medium-size without obstructive disease. Left ventriculography: Left ventricular systolic function is normal , LVEF is estimated at 60 %. Mitral regurgitation induced by PVCs.  Final Conclusions:  1. No evidence of obstructive coronary artery disease.  2. Normal LV systolic function.  3. Moderately elevated left ventricular end-diastolic pressure likely due to diastolic dysfunction.  Recommendations:  Medical therapy. The nuclear stress test was likely false-positive.   2. HTN- HR low on BB so stopped that medication also held HCTZ secondary to increased Creatinine- will need repeat BMP and possible restarting of her medications   Day of Discharge BP 113/55  Pulse 62  Temp(Src) 97.8 F (36.6 C) (Oral)  Resp 14  Ht 5\' 4"  (1.626 m)  Wt 70.4 kg (155 lb 3.3 oz)  BMI 26.64 kg/m2  SpO2 97%  Day of D/C exam Rrr, no murmur Clear to ausculatation x 2 No edema  Results for orders placed during the hospital encounter of 05/07/11 (from the past 48 hour(s))  URINALYSIS, ROUTINE W REFLEX MICROSCOPIC     Status: Abnormal   Collection Time   05/08/11  1:56 PM      Component Value  Range Comment   Color, Urine YELLOW  YELLOW     APPearance CLEAR  CLEAR     Specific Gravity, Urine 1.014  1.005 - 1.030     pH 5.5  5.0 - 8.0     Glucose, UA NEGATIVE  NEGATIVE (mg/dL)    Hgb urine dipstick NEGATIVE  NEGATIVE     Bilirubin Urine NEGATIVE  NEGATIVE     Ketones, ur NEGATIVE  NEGATIVE (mg/dL)    Protein, ur NEGATIVE  NEGATIVE (mg/dL)    Urobilinogen, UA 0.2  0.0 - 1.0 (mg/dL)    Nitrite NEGATIVE  NEGATIVE     Leukocytes, UA SMALL (*) NEGATIVE    URINE CULTURE     Status: Normal   Collection Time   05/08/11  1:56 PM      Component Value Range Comment   Specimen Description URINE, RANDOM      Special Requests NONE      Culture  Setup Time [id number]  Colony Count >=100,000 COLONIES/ML      Culture        Value: Multiple bacterial morphotypes present, none predominant. Suggest appropriate recollection if clinically indicated.   Report Status 05/09/2011 FINAL     URINE MICROSCOPIC-ADD ON     Status: Abnormal   Collection Time   05/08/11  1:56 PM      Component Value Range Comment   Squamous Epithelial / LPF RARE  RARE     WBC, UA 0-2  <3 (WBC/hpf)    RBC / HPF 0-2  <3 (RBC/hpf)    Bacteria, UA RARE  RARE     Casts GRANULAR CAST (*) NEGATIVE    CARDIAC PANEL(CRET KIN+CKTOT+MB+TROPI)     Status: Normal   Collection Time   05/08/11  2:50 PM      Component Value Range Comment   Total CK 127  7 - 177 (U/L)    CK, MB 2.9  0.3 - 4.0 (ng/mL)    Troponin I <0.30  <0.30 (ng/mL)    Relative Index 2.3  0.0 - 2.5    T4, FREE     Status: Normal   Collection Time   05/08/11  4:45 PM      Component Value Range Comment   Free T4 1.14  0.80 - 1.80 (ng/dL)   CBC     Status: Abnormal   Collection Time   05/10/11  4:05 AM      Component Value Range Comment   WBC 6.7  4.0 - 10.5 (K/uL)    RBC 3.82 (*) 3.87 - 5.11 (MIL/uL)    Hemoglobin 10.8 (*) 12.0 - 15.0 (g/dL)    HCT 29.5 (*) 62.1 - 46.0 (%)    MCV 89.5  78.0 - 100.0 (fL)    MCH 28.3  26.0 - 34.0 (pg)    MCHC 31.6  30.0 -  36.0 (g/dL)    RDW 30.8 (*) 65.7 - 15.5 (%)    Platelets 131 (*) 150 - 400 (K/uL)   BASIC METABOLIC PANEL     Status: Abnormal   Collection Time   05/10/11  4:05 AM      Component Value Range Comment   Sodium 142  135 - 145 (mEq/L)    Potassium 3.8  3.5 - 5.1 (mEq/L)    Chloride 109  96 - 112 (mEq/L)    CO2 24  19 - 32 (mEq/L)    Glucose, Bld 166 (*) 70 - 99 (mg/dL)    BUN 22  6 - 23 (mg/dL)    Creatinine, Ser 8.46 (*) 0.50 - 1.10 (mg/dL)    Calcium 9.0  8.4 - 10.5 (mg/dL)    GFR calc non Af Amer 41 (*) >90 (mL/min)    GFR calc Af Amer 47 (*) >90 (mL/min)     Dg Chest 2 View  04/16/2011  *RADIOLOGY REPORT*  Clinical Data: Nausea, syncope, history TIA, hypertension, carotid arterial disease  CHEST - 2 VIEW  Comparison: 08/10/2010  Findings: Normal heart size, mediastinal contours, and pulmonary vascularity. Emphysematous changes without infiltrate or effusion. Minimal linear subsegmental atelectasis versus scarring left base. Clothing artifacts project over left upper lobe. No pneumothorax. Osseous demineralization with thoracolumbar scoliosis.  IMPRESSION: Emphysematous changes with minimal atelectasis or scarring left base. No acute abnormalities.  Original Report Authenticated By: Lollie Marrow, M.D.   Nm Myocar Multi W/spect W/wall Motion / Ef  05/08/2011  77 year old female with hypertension, hyperlipidemia and a positive family history with chest pain. This study is performed  to exclude ischemia.  This is a same day rest stress protocol.  30 mCi of Myoview were used for the stress images and 10 mCi of Myoview were used for the rest images.  Lexiscan was administered in the typical fashion.  The patients resting heart rate was 55 and following infusion 68. The blood pressure at rest was 152/62 and following infusion 127/54.  There was no chest pain during the study.  There were no ST changes.  The study was terminated per protocol.  Scintigraphic results: the images were reconstructed in the  short axis as well as the vertical and horizontal long axis.  The stress images reveal a small defect in the distal anterior wall and apex. When compared to the rest images there is partial reversibility in that distribution.  The gated ejection fraction was 69% and the wall motion was normal.  T I D - 1.04.  End-diastolic volume 81 ml. End-systolic volume 26 ml.  Final interpretation: Lexiscan Myoview with no diagnostic electrocardiographic changes.  The scintigraphic results show soft tissue attenuation and mild ischemia in the distal anterior wall and apex.  The gated ejection fraction was 69% and the wall motion was normal.  Original Report Authenticated By: Bobbie Stack Chest Port 1 View  05/07/2011  *RADIOLOGY REPORT*  Clinical Data: Shortness of breath.  Dyspnea.  PORTABLE CHEST - 1 VIEW  Comparison: 02/13/2012  Findings: 2005 hours. The lungs are clear without focal infiltrate, edema, pneumothorax or pleural effusion. Interstitial markings are diffusely coarsened with chronic features. Cardiopericardial silhouette is at upper limits of normal for size. Telemetry leads overlie the chest.  IMPRESSION: No evidence for focal consolidation nor overt pulmonary edema.  Original Report Authenticated By: ERIC A. MANSELL, M.D.      Disposition: home  Diet: cardiac  Activity: as tolerated, can return to work on 05/15/11   Follow-up Appts: Discharge Orders    Future Appointments: Provider: Department: Dept Phone: Center:   05/21/2011 11:15 AM Wanda Plump, MD [facility] [phone] [facility]   05/29/2011 2:45 PM Valera Castle, MD [facility] [phone] [facility]   06/18/2011 8:15 AM Wanda Plump, MD [facility] [phone] [facility]     Future Orders Please Complete By Expires   Diet - low sodium heart healthy      Increase activity slowly      Scheduling Instructions:   Cath instructions   Discharge instructions      Comments:   May return to work on 3/12       Time spent on  discharge, talking to the patient, and coordinating care: 40 mins.   SignedMarlin Canary, DO 05/10/2011, 12:25 PM

## 2011-05-10 NOTE — Progress Notes (Signed)
Subjective:  Patient stable post cath.  Wrist looks good.  In good spirits.  Findings reviewed with patient.   Objective:  Vital Signs in the last 24 hours: Temp:  [97.8 F (36.6 C)-98.6 F (37 C)] 97.8 F (36.6 C) (03/07 0801) Pulse Rate:  [55-69] 62  (03/07 0801) Resp:  [14-20] 14  (03/07 0801) BP: (108-160)/(47-74) 113/55 mmHg (03/07 0801) SpO2:  [87 %-97 %] 97 % (03/07 0801) Weight:  [155 lb 3.3 oz (70.4 kg)] 155 lb 3.3 oz (70.4 kg) (03/07 0002)  Intake/Output from previous day: 03/06 0701 - 03/07 0700 In: 450 [I.V.:450] Out: -    Physical Exam: General: Well developed, well nourished, in no acute distress. Head:  Normocephalic and atraumatic. Lungs: Clear to auscultation and percussion. Heart: Normal S1 and S2.  No murmur, rubs or gallops.  Pulses: Pulses normal in all 4 extremities. Extremities: No clubbing or cyanosis. No edema.  Cath site stable.   Neurologic: Alert and oriented x 3.    Lab Results:  Basename 05/10/11 0405 05/08/11 0600  WBC 6.7 5.8  HGB 10.8* 11.2*  PLT 131* 127*    Basename 05/10/11 0405 05/08/11 0600  NA 142 144  K 3.8 3.5  CL 109 108  CO2 24 26  GLUCOSE 166* 77  BUN 22 23  CREATININE 1.27* 1.17*    Basename 05/08/11 1450 05/08/11 0600  TROPONINI <0.30 <0.30   Hepatic Function Panel  Basename 05/07/11 2315  PROT 7.1  ALBUMIN 4.0  AST 34  ALT 44*  ALKPHOS 89  BILITOT 0.7  BILIDIR 0.1  IBILI 0.6    Basename 05/08/11 0600  CHOL 119   No results found for this basename: PROTIME in the last 72 hours  Imaging: Nm Myocar Multi W/spect W/wall Motion / Ef  05/08/2011  77 year old female with hypertension, hyperlipidemia and a positive family history with chest pain. This study is performed to exclude ischemia.  This is a same day rest stress protocol.  30 mCi of Myoview were used for the stress images and 10 mCi of Myoview were used for the rest images.  Lexiscan was administered in the typical fashion.  The patients resting  heart rate was 55 and following infusion 68. The blood pressure at rest was 152/62 and following infusion 127/54.  There was no chest pain during the study.  There were no ST changes.  The study was terminated per protocol.  Scintigraphic results: the images were reconstructed in the short axis as well as the vertical and horizontal long axis.  The stress images reveal a small defect in the distal anterior wall and apex. When compared to the rest images there is partial reversibility in that distribution.  The gated ejection fraction was 69% and the wall motion was normal.  T I D - 1.04.  End-diastolic volume 81 ml. End-systolic volume 26 ml.  Final interpretation: Lexiscan Myoview with no diagnostic electrocardiographic changes.  The scintigraphic results show soft tissue attenuation and mild ischemia in the distal anterior wall and apex.  The gated ejection fraction was 69% and the wall motion was normal.  Original Report Authenticated By: Lanell Matar     Assessment/Plan:  Patient Active Hospital Problem List: Chest tightness or pressure (05/07/2011)   Assessment: See cath report.  No sig CAD   Plan: prob home today HYPERLIPIDEMIA (07/31/2006)   Assessment: per primary MD   Plan: per primary MD ANEMIA, PERNICIOUS (07/31/2006)   Assessment: treated per MD   Plan: treated per Md HYPERTENSION (07/31/2006)  Assessment: this is the difficult issue.  She likely will not tolerate beta blockers.  HR slow on no meds.  Needs BP control.   Plan: Followup less than one week with Dr. Drue Novel for redesign of her med list.  Patient aware of the some of the issues.  SLight increase in cr may be due to diuretics which will need to be revisited.          Shawnie Pons, MD, [facility], FSCAI 05/10/2011, 8:53 AM

## 2011-05-10 NOTE — Progress Notes (Signed)
TR BAND REMOVAL  LOCATION:    right radial  DEFLATED PER PROTOCOL:    yes  TIME BAND OFF / DRESSING APPLIED:    2130   SITE UPON ARRIVAL:    Level 0  SITE AFTER BAND REMOVAL:    Level 0  REVERSE ALLEN'S TEST:     positive  CIRCULATION SENSATION AND MOVEMENT:    Within Normal Limits   yes  COMMENTS:   stable

## 2011-05-14 ENCOUNTER — Ambulatory Visit (INDEPENDENT_AMBULATORY_CARE_PROVIDER_SITE_OTHER): Payer: Medicare Other | Admitting: Internal Medicine

## 2011-05-14 ENCOUNTER — Encounter: Payer: Self-pay | Admitting: Internal Medicine

## 2011-05-14 VITALS — BP 120/52 | HR 74 | Temp 98.0°F | Ht 65.25 in | Wt 156.0 lb

## 2011-05-14 DIAGNOSIS — R55 Syncope and collapse: Secondary | ICD-10-CM

## 2011-05-14 DIAGNOSIS — I10 Essential (primary) hypertension: Secondary | ICD-10-CM

## 2011-05-14 DIAGNOSIS — R946 Abnormal results of thyroid function studies: Secondary | ICD-10-CM

## 2011-05-14 LAB — CBC WITH DIFFERENTIAL/PLATELET
Basophils Absolute: 0.1 10*3/uL (ref 0.0–0.1)
Basophils Relative: 0.9 % (ref 0.0–3.0)
Eosinophils Absolute: 0.2 10*3/uL (ref 0.0–0.7)
Lymphocytes Relative: 30.4 % (ref 12.0–46.0)
MCHC: 32.7 g/dL (ref 30.0–36.0)
MCV: 87.7 fl (ref 78.0–100.0)
Monocytes Absolute: 0.6 10*3/uL (ref 0.1–1.0)
Neutrophils Relative %: 56.7 % (ref 43.0–77.0)
Platelets: 145 10*3/uL — ABNORMAL LOW (ref 150.0–400.0)
RBC: 4.09 Mil/uL (ref 3.87–5.11)

## 2011-05-14 LAB — BASIC METABOLIC PANEL
BUN: 22 mg/dL (ref 6–23)
CO2: 25 mEq/L (ref 19–32)
Calcium: 9.3 mg/dL (ref 8.4–10.5)
Creatinine, Ser: 1.1 mg/dL (ref 0.4–1.2)

## 2011-05-14 NOTE — Assessment & Plan Note (Addendum)
Recently admitted after she become bradycardic with Coreg. Per H&P pulse was in the 50s  in the hospital, hydrochlorothiazide was discontinued due to to a mild elevation in creatinine. She was discharged on hydralazine. Still not feeling completely well, slightly dizzy & unsteady noting that the symptoms started even before she started hydralazine. Her BP was a little elevated at home but is very good today. At this point, I think is better to wait and see how she is in the next few days because as compared to last week she is feeling better. If the blood pressure elevation remains the issue, several options are  possible : Restart a very low dose of another beta blocker? Restart hydrochlorothiazide, low-dose? Calcium channel blockers again? In the past she took felodipine and more recently amlodipine and developed mild  Edema that has not resolved completely  even after we discontinue amlodipine. Plan: Labs  Recheck in 10 days Also unable to work this week, she will send paper work to be filled

## 2011-05-14 NOTE — Progress Notes (Signed)
  Subjective:    Patient ID: Tammy [name], female    DOB: 07/25/1934, 77 y.o.   MRN: [id number]  HPI Hospital followup, chart reviewed. Was admitted 05/07/2011 x 3 days due to dizziness, bradycardia, chest pain or shortness of breath. Bradycardia was likely due to recent initiation of Coreg 12.5 twice a day. Workup is reviewed,   Myoview (w/ a defect)  followup by negative cardiac catheterization which was negative except for mild diastolic dysfunction. Labs are reviewed, last creatinine is slightly elevated , hemoglobin slightly low.   Past Medical History:  Coronary artery disease, stress test 2004 "fix defect"  CP-dizzines 3-13---->  myoview showed a defect, cath negative  Cerebrovascular accident 2000, CAROTID ENDARTERECTOMY, RIGHT, -- due to trauma  Hyperlipidemia  Hypertension  Allergic rhinitis- Skin test 04/20/08  Recurrent rhinosinusitis  Several pneumonias and otitis as a child.  Salivary gland infection, s/p removal Dr Ezzard Standing  Hemorrhoid  h/o pernicious anemia  Hemorrhoid  psoriasis (2010)  osteopenia, DEXA 4-10 , started Fosamax  Past Surgical History:  Cholecystectomy  tubal ligation  Hysterectomy, no oophorectomy  Salivary gland & LN resection 2002 ,Dr Ezzard Standing  CAROTID ENDARTERECTOMY, RIGHT 2,000 -- due to trauma  Appendectomy  12-10 extensive cervical spine surgery, decompression, allograft placement  11-2009 R shoulder surgery, rotator cuff repair, bicipital tendin repair (Dr Debby Bud)   Review of Systems Patient left the hospital last week, the day after she felt pretty bad, very weak and dizzy. Described the dizziness as "unsteadiness" mostly when she stands or tries to walk. Not related to motion of the head. In the last 2 days, she is feeling slightly better but not 100%. Her BP meds were changed, see assessment and plan, good compliance. No further CP or shortness of breath. Denies any problems with slurred speech, focal deficits. Had a couple of  headaches since she left the hospital, not the worst of her life. Ambulatory BPs, this morning was 168/67, later 148/64 and at the time of this visit is normal.     Objective:   Physical Exam  Constitutional: She is oriented to person, place, and time. She appears well-developed and well-nourished.  HENT:  Head: Normocephalic and atraumatic.  Cardiovascular: Normal rate, regular rhythm and normal heart sounds.   No murmur heard. Pulmonary/Chest: Effort normal and breath sounds normal. No respiratory distress. She has no wheezes. She has no rales.  Musculoskeletal: Edema: continue w/ trace edema.  Neurological: She is alert and oriented to person, place, and time.       Speach, gait, motor is normal  Psychiatric: She has a normal mood and affect. Her behavior is normal. Judgment and thought content normal.      Assessment & Plan:

## 2011-05-14 NOTE — Assessment & Plan Note (Signed)
At the hospital, TSH was slightly elevated, free T4 normal. Plan to recheck in few weeks. Patient aware.

## 2011-05-14 NOTE — Assessment & Plan Note (Signed)
Admitted again with a near-syncope, cardiac catheterization was negative. BP meds were changed. Still not feeling completely well, remains slightly dizzy, fatigued. Plan: Labs Continue his same medications for now.

## 2011-05-15 DIAGNOSIS — Z0279 Encounter for issue of other medical certificate: Secondary | ICD-10-CM

## 2011-05-16 ENCOUNTER — Other Ambulatory Visit: Payer: Self-pay | Admitting: Internal Medicine

## 2011-05-16 NOTE — Telephone Encounter (Signed)
Refill request Klonopin #[id number] with zero refills. OK to refill?

## 2011-05-17 ENCOUNTER — Encounter: Payer: Self-pay | Admitting: *Deleted

## 2011-05-21 ENCOUNTER — Ambulatory Visit: Payer: Medicare Other | Admitting: Internal Medicine

## 2011-05-22 NOTE — Telephone Encounter (Signed)
90, 1 RF 

## 2011-05-23 NOTE — Telephone Encounter (Signed)
Refill done.  

## 2011-05-25 ENCOUNTER — Ambulatory Visit (INDEPENDENT_AMBULATORY_CARE_PROVIDER_SITE_OTHER): Payer: Medicare Other | Admitting: Internal Medicine

## 2011-05-25 VITALS — BP 160/86 | HR 76 | Temp 98.1°F | Wt 153.0 lb

## 2011-05-25 DIAGNOSIS — I10 Essential (primary) hypertension: Secondary | ICD-10-CM

## 2011-05-25 MED ORDER — HYDROCHLOROTHIAZIDE 25 MG PO TABS: 12.5000 mg | ORAL_TABLET | Freq: Every day | ORAL | Status: DC

## 2011-05-25 NOTE — Assessment & Plan Note (Addendum)
Fortunately she feels better. BP under better control except for the last few days. Plan: Decrease hydralazine to twice a day to avoid a complicated regimen (TID). Restart a low dose of hydrochlorothiazide with close followup of her BMP. See instructions.

## 2011-05-25 NOTE — Progress Notes (Signed)
  Subjective:    Patient ID: Tammy [name], female    DOB: May 23, 1934, 77 y.o.   MRN: [id number]  HPI Followup from previous visit She is taking the medications as prescribed, ambulatory blood pressures have been better mostly in the 120s-150s / 50s- 60s; only in the last few days her BPs have increased to 170 - 180 systolic. The diastolic remains normal  Past Medical History:  Hyperlipidemia  Hypertension CV: --Coronary artery disease, stress test 2004 "fix defect"  --CP-dizzines 3-13----> myoview showed a defect, cath negative  --Cerebrovascular accident 2000, CAROTID ENDARTERECTOMY, RIGHT, -- due to trauma  Allergic rhinitis- Skin test 04/20/08  Recurrent rhinosinusitis  Several pneumonias and otitis as a child.  Hemorrhoid  h/o pernicious anemia  psoriasis (2010)  osteopenia, DEXA 4-10 , started Fosamax   Past Surgical History:  Cholecystectomy  tubal ligation  Hysterectomy, no oophorectomy  Salivary gland & LN resection 2002 ,Dr Ezzard Standing  CAROTID ENDARTERECTOMY, RIGHT 2,000 -- due to trauma  Appendectomy  12-10 extensive cervical spine surgery, decompression, allograft placement  11-2009 R shoulder surgery, rotator cuff repair, bicipital tendin repair (Dr Debby Bud)      Review of Systems Feels well, denies any dizziness or unsteadiness.    Objective:   Physical Exam  A, Ox3 No LE edema      Assessment & Plan:

## 2011-05-25 NOTE — Patient Instructions (Signed)
Schedule a BMP-- dx hypertension, in 2 weeks As long as the blood pressures less than 140/85 we are okay. Come back in 3 months.

## 2011-05-27 ENCOUNTER — Encounter: Payer: Self-pay | Admitting: Internal Medicine

## 2011-05-29 ENCOUNTER — Ambulatory Visit (INDEPENDENT_AMBULATORY_CARE_PROVIDER_SITE_OTHER): Payer: Medicare Other | Admitting: Cardiology

## 2011-05-29 ENCOUNTER — Encounter: Payer: Self-pay | Admitting: Cardiology

## 2011-05-29 VITALS — BP 166/75 | HR 70 | Resp 18 | Ht 65.0 in | Wt 152.0 lb

## 2011-05-29 DIAGNOSIS — R0789 Other chest pain: Secondary | ICD-10-CM

## 2011-05-29 DIAGNOSIS — I498 Other specified cardiac arrhythmias: Secondary | ICD-10-CM

## 2011-05-29 DIAGNOSIS — Z9889 Other specified postprocedural states: Secondary | ICD-10-CM

## 2011-05-29 DIAGNOSIS — Z8679 Personal history of other diseases of the circulatory system: Secondary | ICD-10-CM

## 2011-05-29 DIAGNOSIS — R001 Bradycardia, unspecified: Secondary | ICD-10-CM

## 2011-05-29 DIAGNOSIS — I6529 Occlusion and stenosis of unspecified carotid artery: Secondary | ICD-10-CM

## 2011-05-29 DIAGNOSIS — I10 Essential (primary) hypertension: Secondary | ICD-10-CM

## 2011-05-29 NOTE — Progress Notes (Signed)
HPI  Tammy [name] returns today for evaluation and management of her chest pressure. She was admitted to the hospital and ruled out for MI. She had a stress Myoview which suggested some ischemia. Cardiac catheterization demonstrated no coronary disease. She has normal left ventricular function.  Her blood pressure meds have been changed recently. He was bradycardic on carvedilol and then was switched 2 hydralazine and HCTZ. She remains on losartan. Past Medical History  Diagnosis Date  . CAD (coronary artery disease)     stress test 2003 "fix defect"  . Cerebrovascular accident 2000    Carotid endarterectomy,right-- due to trauma  . Hyperlipidemia   . Hypertension   . Allergic rhinitis     skin test 04/20/08  . Pneumonia     several pneumonias as a child, and otitis  . Salivary gland infection     s/p removal Dr Ezzard Standing  . Hemorrhoid   . Psoriasis 2010  . Osteopenia 4/10    DEXA,started fosamx  . Shingles 02/2010  . ANEMIA, PERNICIOUS 07/31/2006    Current Outpatient Prescriptions  Medication Sig Dispense Refill  . aspirin 81 MG tablet Take 81 mg by mouth at bedtime.       . clobetasol cream (TEMOVATE) 0.05 % Apply 1 application topically 2 (two) times daily as needed. For psoriasis      . clonazePAM (KLONOPIN) 1 MG tablet TAKE 1 TABLET BY MOUTH AS NEEDED  90 tablet  1  . cyanocobalamin (,VITAMIN B-12,) 1000 MCG/ML injection Inject 1,000 mcg into the muscle every 30 (thirty) days.      . fluticasone (FLONASE) 50 MCG/ACT nasal spray Place 2 sprays into the nose daily as needed. For congestion      . hydrALAZINE (APRESOLINE) 25 MG tablet Take 25 mg by mouth 2 (two) times daily.      . hydrochlorothiazide (HYDRODIURIL) 25 MG tablet Take 0.5 tablets (12.5 mg total) by mouth at bedtime.  30 tablet  6  . losartan (COZAAR) 100 MG tablet Take 100 mg by mouth at bedtime.      . rosuvastatin (CRESTOR) 40 MG tablet Take 40 mg by mouth at bedtime.        Allergies  Allergen Reactions  .  Contrast Media (Iodinated Diagnostic Agents)   . Amoxicillin     REACTION: sick on stomach  . Clarithromycin     REACTION: sick on stomach  . Codeine     REACTION: "knocks me out"  . Cyclobenzaprine Hcl     REACTION: "paralysis"  . Hydrocodone     REACTION:"knocks me out"  . Morphine Sulfate     REACTION: "stop my heart"  . Percocet (Oxycodone-Acetaminophen) Nausea Only  . Tramadol Hcl     REACTION: nausea    Family History  Problem Relation Age of Onset  . Hypertension Mother   . Hyperlipidemia Mother   . Colon cancer Mother   . Coronary artery disease Father   . Parkinsonism Father   . Heart disease Father   . Coronary artery disease Brother 53  . Breast cancer Neg Hx     History   Social History  . Marital Status: Divorced    Spouse Name: N/A    Number of Children: 3  . Years of Education: N/A   Occupational History  . [employer] Greeter-stands in doorway    Social History Main Topics  . Smoking status: Former Smoker    Types: Cigarettes    Quit date: 11/20/1992  . Smokeless tobacco: Never Used  .  Alcohol Use: 0.5 oz/week    1 drink(s) per week  . Drug Use: No  . Sexually Active: Not on file   Other Topics Concern  . Not on file   Social History Narrative  . No narrative on file    ROS ALL NEGATIVE EXCEPT THOSE NOTED IN HPI  PE  General Appearance: well developed, well nourished in no acute distress HEENT: symmetrical face, PERRLA, good dentition  Neck: no JVD, thyromegaly, or adenopathy, trachea midline Chest: symmetric without deformity Cardiac: PMI non-displaced, RRR, normal S1, S2, no gallop or murmur Lung: clear to ausculation and percussion Vascular: all pulses full, Right carotid bruit Abdominal: nondistended, nontender, good bowel sounds, no HSM, no bruits Extremities: no cyanosis, clubbing or edema, no sign of DVT, no varicosities  Skin: normal color, no rashes Neuro: alert and oriented x 3, non-focal Pysch: normal  affect  EKG  BMET    Component Value Date/Time   NA 139 05/14/2011 1402   K 3.8 05/14/2011 1402   CL 107 05/14/2011 1402   CO2 25 05/14/2011 1402   GLUCOSE 76 05/14/2011 1402   GLUCOSE 87 02/19/2006 1207   BUN 22 05/14/2011 1402   CREATININE 1.1 05/14/2011 1402   CREATININE 1.40* 04/20/2011 1522   CALCIUM 9.3 05/14/2011 1402   GFRNONAA 41* 05/10/2011 0405   GFRAA 47* 05/10/2011 0405    Lipid Panel     Component Value Date/Time   CHOL 119 05/08/2011 0600   TRIG 83 05/08/2011 0600   HDL 62 05/08/2011 0600   CHOLHDL 1.9 05/08/2011 0600   VLDL 17 05/08/2011 0600   LDLCALC 40 05/08/2011 0600    CBC    Component Value Date/Time   WBC 6.1 05/14/2011 1402   RBC 4.09 05/14/2011 1402   HGB 11.7* 05/14/2011 1402   HCT 35.9* 05/14/2011 1402   PLT 145.0* 05/14/2011 1402   MCV 87.7 05/14/2011 1402   MCH 28.3 05/10/2011 0405   MCHC 32.7 05/14/2011 1402   RDW 16.4* 05/14/2011 1402   LYMPHSABS 1.9 05/14/2011 1402   MONOABS 0.6 05/14/2011 1402   EOSABS 0.2 05/14/2011 1402   BASOSABS 0.1 05/14/2011 1402

## 2011-05-29 NOTE — Assessment & Plan Note (Signed)
Resolved

## 2011-05-29 NOTE — Assessment & Plan Note (Signed)
Asymptomatic. Arrange carotid Dopplers this fall.

## 2011-05-29 NOTE — Patient Instructions (Addendum)
Your physician wants you to follow-up in: 12 months.  You will receive a reminder letter in the mail two months in advance. If you don't receive a letter, please call our office to schedule the follow-up appointment.  Your physician has requested that you have a carotid duplex in September 2013. This test is an ultrasound of the carotid arteries in your neck. It looks at blood flow through these arteries that supply the brain with blood. Allow one hour for this exam. There are no restrictions or special instructions.

## 2011-05-29 NOTE — Assessment & Plan Note (Signed)
Resolved off of carvedilol.

## 2011-05-29 NOTE — Assessment & Plan Note (Signed)
I rechecked before she left the office. It was 132/80. No change in treatment.

## 2011-05-30 ENCOUNTER — Other Ambulatory Visit: Payer: Self-pay | Admitting: Internal Medicine

## 2011-05-30 NOTE — Telephone Encounter (Signed)
Refill done.  

## 2011-06-08 ENCOUNTER — Other Ambulatory Visit (INDEPENDENT_AMBULATORY_CARE_PROVIDER_SITE_OTHER): Payer: Medicare Other

## 2011-06-08 DIAGNOSIS — I10 Essential (primary) hypertension: Secondary | ICD-10-CM

## 2011-06-08 LAB — BASIC METABOLIC PANEL
BUN: 24 mg/dL — ABNORMAL HIGH (ref 6–23)
Chloride: 105 mEq/L (ref 96–112)
GFR: 49.92 mL/min — ABNORMAL LOW (ref >60.00)
Potassium: 4.2 mEq/L (ref 3.5–5.1)
Sodium: 141 mEq/L (ref 135–145)

## 2011-06-13 ENCOUNTER — Encounter: Payer: Self-pay | Admitting: *Deleted

## 2011-06-18 ENCOUNTER — Other Ambulatory Visit: Payer: Self-pay | Admitting: *Deleted

## 2011-06-18 ENCOUNTER — Ambulatory Visit: Payer: Medicare Other | Admitting: Internal Medicine

## 2011-06-18 NOTE — Telephone Encounter (Signed)
Last OV 05-25-11, Med was Rx in hospital ok to fill. .Please advise

## 2011-06-19 MED ORDER — HYDRALAZINE HCL 25 MG PO TABS: 25.0000 mg | ORAL_TABLET | Freq: Two times a day (BID) | ORAL | Status: DC

## 2011-06-19 NOTE — Telephone Encounter (Signed)
Discuss with patient, Rx sent. 

## 2011-06-19 NOTE — Telephone Encounter (Signed)
Rx sent 

## 2011-06-19 NOTE — Telephone Encounter (Signed)
Left message to call office

## 2011-07-05 ENCOUNTER — Ambulatory Visit (INDEPENDENT_AMBULATORY_CARE_PROVIDER_SITE_OTHER): Payer: Medicare Other | Admitting: Internal Medicine

## 2011-07-05 ENCOUNTER — Encounter: Payer: Self-pay | Admitting: Internal Medicine

## 2011-07-05 VITALS — BP 122/70 | HR 65 | Temp 98.5°F | Wt 152.0 lb

## 2011-07-05 DIAGNOSIS — J40 Bronchitis, not specified as acute or chronic: Secondary | ICD-10-CM

## 2011-07-05 DIAGNOSIS — J069 Acute upper respiratory infection, unspecified: Secondary | ICD-10-CM

## 2011-07-05 MED ORDER — AZITHROMYCIN 250 MG PO TABS: ORAL_TABLET | ORAL | Status: AC

## 2011-07-05 MED ORDER — FLUTICASONE PROPIONATE 50 MCG/ACT NA SUSP: NASAL | Status: DC

## 2011-07-05 NOTE — Patient Instructions (Signed)
Plain Mucinex for thick secretions ;force NON dairy fluids . Use a Neti pot daily as needed for sinus congestion; going from open side to congested side . Nasal cleansing in the shower as discussed. Make sure that all residual soap is removed to prevent irritation. Fluticasone 1 spray in each nostril twice a day as needed. Use the "crossover" technique as discussed. Plain Allegra 160 daily as needed for itchy eyes & sneezing.    

## 2011-07-05 NOTE — Progress Notes (Signed)
  Subjective:    Patient ID: Tammy [name], female    DOB: 1934-04-12, 77 y.o.   MRN: [id number]  HPI symptoms began approximately 3 days ago as a burning scratchy throat; she thought that this may have been due to the food trigger. She subsequently developed head congestion with frontal headache and facial pain. A sore throat persists; she denies nasal purulence. Because of the head congestion, she could not sleep last night. She states this is cause of swelling of her eyes   Last night she began having a cough with green sputum.  She has had associated extrinsic symptoms with it she water he eyes and sneezing.  She's been taking Tylenol for headache  & Claritin for allergic symptoms without significant response  Past medical history/family history/social history were all reviewed and updated. Pertinent data: She describes chronic recurrent sinusitis but no history of asthma .She had recurrent "pneumonia" as a child      Review of Systems She is not having pleurisy but has noted some wheezing today. She denies shortness of breath. She denies chest pain, palpitations, or edema.                                            Objective:   Physical Exam General appearance:good health ;well nourished; no acute distress or increased work of breathing is present but appears tired.  No  lymphadenopathy about the head, neck, or axilla noted.   Eyes: No conjunctival inflammation or purulence , but lid edema is present. Variable vision contacts worn. EOMI  Ears:  External ear exam shows no significant lesions or deformities.  Otoscopic examination reveals clear canals, tympanic membranes are intact bilaterally without bulging, retraction, inflammation or discharge.  Nose:  External nasal examination shows no deformity or inflammation. Nasal mucosa are pink and moist without lesions or exudates. No septal dislocation or deviation.No obstruction to airflow.   Oral exam: Dental hygiene is good; lips  and gums are healthy appearing.There is no oropharyngeal erythema or exudate noted.   Heart:  Normal rate and regular rhythm. S1 and S2 normal without gallop, murmur, click, rub or other extra sounds.   Lungs:Chest clear to auscultation; no wheezes, rhonchi,rales ,or rubs present.No increased work of breathing.    Extremities:  No cyanosis, edema, or clubbing  noted    Skin: Warm & dry           Assessment & Plan:   #1 acute bronchitis w/o bronchospasm #2 URI Plan: See orders and recommendations

## 2011-08-02 ENCOUNTER — Other Ambulatory Visit: Payer: Self-pay | Admitting: Internal Medicine

## 2011-08-02 MED ORDER — ROSUVASTATIN CALCIUM 40 MG PO TABS: 40.0000 mg | ORAL_TABLET | Freq: Every day | ORAL | Status: DC

## 2011-08-02 NOTE — Telephone Encounter (Signed)
Refill done.  

## 2011-08-02 NOTE — Telephone Encounter (Signed)
refill crestor 40mg  Qty 30 No instructions listed  Noted: Patient is out and is requesting refill wt/additional refills Last fill 4.25.13  Last ov 5.2.13 Looks like last written by Drue Novel 04/2011 Instr. TAKE 1 TABLET AT BEDTIME

## 2011-08-23 ENCOUNTER — Ambulatory Visit: Payer: Medicare Other | Admitting: Internal Medicine

## 2011-08-28 ENCOUNTER — Ambulatory Visit (INDEPENDENT_AMBULATORY_CARE_PROVIDER_SITE_OTHER): Payer: Medicare Other | Admitting: Internal Medicine

## 2011-08-28 ENCOUNTER — Encounter: Payer: Self-pay | Admitting: Internal Medicine

## 2011-08-28 VITALS — BP 126/80 | HR 56 | Temp 98.0°F | Wt 152.0 lb

## 2011-08-28 DIAGNOSIS — I10 Essential (primary) hypertension: Secondary | ICD-10-CM

## 2011-08-28 DIAGNOSIS — L408 Other psoriasis: Secondary | ICD-10-CM

## 2011-08-28 DIAGNOSIS — R946 Abnormal results of thyroid function studies: Secondary | ICD-10-CM

## 2011-08-28 DIAGNOSIS — R55 Syncope and collapse: Secondary | ICD-10-CM

## 2011-08-28 MED ORDER — CLOBETASOL PROPIONATE 0.05 % EX CREA: 1.0000 "application " | TOPICAL_CREAM | Freq: Two times a day (BID) | CUTANEOUS | Status: DC | PRN

## 2011-08-28 NOTE — Assessment & Plan Note (Signed)
No further episodes

## 2011-08-28 NOTE — Assessment & Plan Note (Signed)
Needs a refill on her steroid creams. Done

## 2011-08-28 NOTE — Assessment & Plan Note (Signed)
Recheck a TSH 

## 2011-08-28 NOTE — Progress Notes (Signed)
  Subjective:    Patient ID: Tammy [name], female    DOB: 1934/08/22, 77 y.o.   MRN: [id number]  HPI Routine office visit Hypertension, good medication compliance, ambulatory BPs 134/70.  Past Medical History:   Hyperlipidemia   Hypertension CV: --Coronary artery disease, stress test 2004 "fix defect"   --CP-dizzines 3-13----> myoview showed a defect, cath negative   --Cerebrovascular accident 2000, CAROTID ENDARTERECTOMY, RIGHT, -- due to trauma   Allergic rhinitis- Skin test 04/20/08   Recurrent rhinosinusitis   Several pneumonias and otitis as a child.   Hemorrhoid   h/o pernicious anemia   psoriasis (2010)   osteopenia, DEXA 4-10 , started Fosamax   Past Surgical History:   Cholecystectomy   tubal ligation   Hysterectomy, no oophorectomy   Salivary gland & LN resection 2002 ,Dr Ezzard Standing   CAROTID ENDARTERECTOMY, RIGHT 2,000 -- due to trauma   Appendectomy   12-10 extensive cervical spine surgery, decompression, allograft placement   11-2009 R shoulder surgery, rotator cuff repair, bicipital tendin repair (Dr Debby Bud)       Review of Systems In general states she feels great, "better than in a long time". Good compliance with all medications. She is active and plans to join the gym. No chest pain, no syncopal spells.    Objective:   Physical Exam General -- alert, well-developed, and well-nourished.   Lungs -- normal respiratory effort, no intercostal retractions, no accessory muscle use, and normal breath sounds.   Heart-- normal rate, regular rhythm, no murmur, and no gallop.   Extremities-- no pretibial edema bilaterally Neurologic-- alert & oriented X3 and strength normal in all extremities. Psych-- Cognition and judgment appear intact. Alert and cooperative with normal attention span and concentration.  not anxious appearing and not depressed appearing.        Assessment & Plan:

## 2011-08-28 NOTE — Assessment & Plan Note (Signed)
Under excellent control. Last BMP normal. No change

## 2011-09-16 ENCOUNTER — Telehealth: Payer: Self-pay | Admitting: Internal Medicine

## 2011-09-16 NOTE — Telephone Encounter (Signed)
Ordered a TSH @ last OV, apparently not done, please schedule

## 2011-09-19 NOTE — Telephone Encounter (Signed)
Lmovm for pt to call office. °

## 2011-09-19 NOTE — Telephone Encounter (Signed)
Diasia [phone]) calling back for [place] after she missed a call earlier in the week. RN reviewed EPIC chart and advised caller that she needs to schedule TSH. She is agreeable and was transferred to [place] at appt desk.

## 2011-09-19 NOTE — Telephone Encounter (Signed)
appt scheduled 7.18.13 930am

## 2011-09-20 ENCOUNTER — Other Ambulatory Visit (INDEPENDENT_AMBULATORY_CARE_PROVIDER_SITE_OTHER): Payer: Medicare Other

## 2011-09-20 DIAGNOSIS — E785 Hyperlipidemia, unspecified: Secondary | ICD-10-CM

## 2011-09-20 NOTE — Progress Notes (Unsigned)
Pt will come back in 09/21/11 for lab work could not obtain, due to pt being dehydrated .Marland Kitchen

## 2011-09-21 LAB — TSH: TSH: 2.86 u[IU]/mL (ref 0.35–5.50)

## 2011-09-24 ENCOUNTER — Encounter: Payer: Self-pay | Admitting: *Deleted

## 2011-09-30 ENCOUNTER — Other Ambulatory Visit: Payer: Self-pay | Admitting: Internal Medicine

## 2011-10-01 NOTE — Telephone Encounter (Signed)
Refill done.  

## 2011-11-06 ENCOUNTER — Other Ambulatory Visit: Payer: Self-pay | Admitting: *Deleted

## 2011-11-06 ENCOUNTER — Encounter (INDEPENDENT_AMBULATORY_CARE_PROVIDER_SITE_OTHER): Payer: Medicare Other

## 2011-11-06 DIAGNOSIS — I6529 Occlusion and stenosis of unspecified carotid artery: Secondary | ICD-10-CM

## 2011-12-21 ENCOUNTER — Ambulatory Visit (INDEPENDENT_AMBULATORY_CARE_PROVIDER_SITE_OTHER): Payer: Medicare Other | Admitting: Internal Medicine

## 2011-12-21 VITALS — BP 138/82 | HR 76 | Temp 98.2°F | Wt 157.0 lb

## 2011-12-21 DIAGNOSIS — N390 Urinary tract infection, site not specified: Secondary | ICD-10-CM

## 2011-12-21 DIAGNOSIS — R109 Unspecified abdominal pain: Secondary | ICD-10-CM

## 2011-12-21 LAB — POCT URINALYSIS DIPSTICK
Bilirubin, UA: NEGATIVE
Glucose, UA: NEGATIVE
Nitrite, UA: NEGATIVE
Urobilinogen, UA: 0.2

## 2011-12-21 MED ORDER — SULFAMETHOXAZOLE-TRIMETHOPRIM 800-160 MG PO TABS: 1.0000 | ORAL_TABLET | Freq: Two times a day (BID) | ORAL | Status: DC

## 2011-12-21 MED ORDER — CIPROFLOXACIN HCL 500 MG PO TABS: 500.0000 mg | ORAL_TABLET | Freq: Two times a day (BID) | ORAL | Status: DC

## 2011-12-21 NOTE — Patient Instructions (Addendum)
Drink plenty of fluids, Tylenol as needed for pain. Start taking the antibiotic today. Call anytime if you feel worse, you have high fever or you are not improving in the next 2 or 3 days.

## 2011-12-21 NOTE — Progress Notes (Signed)
  Subjective:    Patient ID: Tammy [name], female    DOB: 08/28/1934, 77 y.o.   MRN: [id number]  HPI Acute visit 2 weeks history of bilateral lower back pain and bilateral lower abdominal pain. Symptoms increased with walking. Symptoms are worse when she moves around, has been doing heavy pushing-lifting (miving to a new house)  Past Medical History:   Hyperlipidemia   Hypertension CV: --Coronary artery disease, stress test 2004 "fix defect"   --CP-dizzines 3-13----> myoview showed a defect, cath negative   --Cerebrovascular accident 2000, CAROTID ENDARTERECTOMY, RIGHT, -- due to trauma   Allergic rhinitis- Skin test 04/20/08   Recurrent rhinosinusitis   Several pneumonias and otitis as a child.   Hemorrhoid   h/o pernicious anemia   psoriasis (2010)   osteopenia, DEXA 4-10 , started Fosamax   Past Surgical History:   Cholecystectomy   tubal ligation   Hysterectomy, no oophorectomy   Salivary gland & LN resection 2002 ,Dr Ezzard Standing   CAROTID ENDARTERECTOMY, RIGHT 2,000 -- due to trauma   Appendectomy   12-10 extensive cervical spine surgery, decompression, allograft placement   11-2009 R shoulder surgery, rotator cuff repair, bicipital tendin repair (Dr Debby Bud)      Review of Systems No fever or chills Occasional dysuria which is slightly more than baseline and nocturia is at baseline. No gross hematuria. Has not been sexually active in the last 2 weeks, denies any vaginal discharge or bleeding. Appetite is normal, no constipation, diarrhea, actually bowel movements are normal.     Objective:   Physical Exam General -- alert, well-developed, and well-nourished.   Abdomen--not distended, good bowel sounds, tender in the lower abdomen symmetrically, no mass or rebound..   Extremities-- no pretibial edema bilaterally Back--no tender to palpation. Neurologic-- alert & oriented X3 and strength normal in all extremities. Psych-- Cognition and judgment appear intact. Alert and  cooperative with normal attention span and concentration.  not anxious appearing and not depressed appearing.       Assessment & Plan:  Abdominal pain, back pain. 77 year old lady with a history of hysterectomy, appendectomy, cholecystectomy with lower abdominal pain and back pain, urinalysis showed breath. The most likely diagnosis is a UTI although she has been doing some lifting and the pain has some mechanical features so a muscle achiness may be playing a role. Plan: Treat as a UTI, see instructions. I initially prescribed Bactrim, she stated that she can't  tolerate it, consequently we'll try Cipro.

## 2011-12-22 ENCOUNTER — Encounter: Payer: Self-pay | Admitting: Internal Medicine

## 2011-12-25 LAB — URINE CULTURE: Colony Count: 35000

## 2011-12-28 ENCOUNTER — Encounter: Payer: Medicare Other | Admitting: Internal Medicine

## 2012-02-05 ENCOUNTER — Other Ambulatory Visit: Payer: Self-pay | Admitting: Internal Medicine

## 2012-02-05 MED ORDER — CLONAZEPAM 1 MG PO TABS: 1.0000 mg | ORAL_TABLET | ORAL | Status: DC | PRN

## 2012-02-05 NOTE — Telephone Encounter (Signed)
Needs 90day refill on CLONAZEPAM 1 mg tablet  Also needs to go to new pharmacy: [facility] store [id number] [place], [place] [address] [phone]

## 2012-02-05 NOTE — Telephone Encounter (Signed)
Refill done.  

## 2012-02-25 ENCOUNTER — Other Ambulatory Visit: Payer: Self-pay | Admitting: Internal Medicine

## 2012-02-25 NOTE — Telephone Encounter (Signed)
Refill done.  

## 2012-02-29 ENCOUNTER — Other Ambulatory Visit: Payer: Self-pay | Admitting: *Deleted

## 2012-02-29 MED ORDER — CYANOCOBALAMIN 1000 MCG/ML IJ SOLN: 1000.0000 ug | INTRAMUSCULAR | Status: DC

## 2012-02-29 NOTE — Telephone Encounter (Signed)
yes

## 2012-02-29 NOTE — Telephone Encounter (Signed)
Please advise ok to filled vitamin b-12

## 2012-02-29 NOTE — Telephone Encounter (Signed)
Refill done.  

## 2012-03-05 HISTORY — PX: CATARACT EXTRACTION W/ INTRAOCULAR LENS  IMPLANT, BILATERAL: SHX1307

## 2012-03-17 ENCOUNTER — Encounter: Payer: Medicare Other | Admitting: Internal Medicine

## 2012-03-17 ENCOUNTER — Ambulatory Visit (INDEPENDENT_AMBULATORY_CARE_PROVIDER_SITE_OTHER): Payer: Medicare Other | Admitting: Internal Medicine

## 2012-03-17 VITALS — BP 126/72 | HR 61 | Ht 65.0 in | Wt 155.0 lb

## 2012-03-17 DIAGNOSIS — M899 Disorder of bone, unspecified: Secondary | ICD-10-CM

## 2012-03-17 DIAGNOSIS — I10 Essential (primary) hypertension: Secondary | ICD-10-CM

## 2012-03-17 DIAGNOSIS — N6459 Other signs and symptoms in breast: Secondary | ICD-10-CM

## 2012-03-17 DIAGNOSIS — D510 Vitamin B12 deficiency anemia due to intrinsic factor deficiency: Secondary | ICD-10-CM

## 2012-03-17 DIAGNOSIS — Z0000 Encounter for general adult medical examination without abnormal findings: Secondary | ICD-10-CM

## 2012-03-17 DIAGNOSIS — E785 Hyperlipidemia, unspecified: Secondary | ICD-10-CM

## 2012-03-17 DIAGNOSIS — M949 Disorder of cartilage, unspecified: Secondary | ICD-10-CM

## 2012-03-17 DIAGNOSIS — N390 Urinary tract infection, site not specified: Secondary | ICD-10-CM

## 2012-03-17 LAB — CBC WITH DIFFERENTIAL/PLATELET
Basophils Absolute: 0 10*3/uL (ref 0.0–0.1)
HCT: 39.9 % (ref 36.0–46.0)
Hemoglobin: 12.9 g/dL (ref 12.0–15.0)
Lymphs Abs: 1.8 10*3/uL (ref 0.7–4.0)
MCV: 86.5 fl (ref 78.0–100.0)
Monocytes Absolute: 0.6 10*3/uL (ref 0.1–1.0)
Monocytes Relative: 9.9 % (ref 3.0–12.0)
Neutro Abs: 3.3 10*3/uL (ref 1.4–7.7)
Platelets: 158 10*3/uL (ref 150.0–400.0)
RDW: 16.3 % — ABNORMAL HIGH (ref 11.5–14.6)

## 2012-03-17 LAB — BASIC METABOLIC PANEL
CO2: 29 mEq/L (ref 19–32)
Calcium: 10.1 mg/dL (ref 8.4–10.5)
Creatinine, Ser: 1.3 mg/dL — ABNORMAL HIGH (ref 0.4–1.2)
GFR: 44.34 mL/min — ABNORMAL LOW (ref >60.00)
Sodium: 142 mEq/L (ref 135–145)

## 2012-03-17 LAB — POCT URINALYSIS DIPSTICK
Blood, UA: NEGATIVE
Glucose, UA: NEGATIVE
Ketones, UA: NEGATIVE
Protein, UA: NEGATIVE
Spec Grav, UA: 1.01

## 2012-03-17 LAB — LIPID PANEL
Cholesterol: 140 mg/dL (ref 0–200)
HDL: 71.8 mg/dL (ref >39.00)
Total CHOL/HDL Ratio: 2
Triglycerides: 85 mg/dL (ref 0.0–149.0)

## 2012-03-17 LAB — AST: AST: 21 U/L (ref 0–37)

## 2012-03-17 MED ORDER — CLOBETASOL PROPIONATE 0.05 % EX CREA: TOPICAL_CREAM | Freq: Two times a day (BID) | CUTANEOUS | Status: DC

## 2012-03-17 MED ORDER — ZOSTER VACCINE LIVE 19400 UNT/0.65ML ~~LOC~~ SOLR: 0.6500 mL | Freq: Once | SUBCUTANEOUS | Status: DC

## 2012-03-17 MED ORDER — CYANOCOBALAMIN 1000 MCG/ML IJ SOLN: 1000.0000 ug | INTRAMUSCULAR | Status: DC

## 2012-03-17 NOTE — Assessment & Plan Note (Signed)
Last bone density test 03-2011----> osteopenia, also there is improvement at the right hip bone mineral density.  Plan:  We  discontinue Fosamax, continue with physical activity, calcium, vitamin D.  Next density test in 3 years

## 2012-03-17 NOTE — Assessment & Plan Note (Signed)
Due for labs

## 2012-03-17 NOTE — Assessment & Plan Note (Addendum)
Td 2003 and today flu shot 01-2012 pneumonia shot x 2  shingles shot -- Rx provided hysterectmy years ago (d/t bleeding) , no oophorectomy, no h/o abnormal PAPs . Last PAP [phone] normal-----> no further Paps last MMG 03-2011 normal breast exam today-- normal right side?Tammy [name] schedule a mammogram and a right breast ultrasound  Cscope 2002 ----> repeat colonoscopy 12-2010,  6 mm polyp, next colonoscopy per GI   diet exercise discussed

## 2012-03-17 NOTE — Progress Notes (Signed)
Subjective:    Patient ID: Tammy [name], female    DOB: 01/28/1935, 78 y.o.   MRN: [id number]  HPI  Here for Medicare AWV: 1.         Risk factors based on Past M, S, F history: reviewed   2.         Physical Activities: very active, silver snickers  3.         Depression/mood: (-) screening  4.         Hearing: mild B tinnitus x years, no problems hearing   5.         ADL's: totally independent 6.         Fall Risk: low risk, no h/o falls  , see instructions  7.         Home Safety: does feel safe at home 8.         Height, weight, &visual acuity: see VS, wears contacts, doing well   9.         Counseling: yes, see below 10.       Labs ordered based on risk factors: yes 11.           Referral Coordination, if needed 12.           Care Plan-- see a/p 13.            Cognitive Assessment, motor skills, memory and balance seems stable   in addition, we discussed the following issues: High cholesterol, good medication compliance, no apparent side effects. Hypertension, good medication compliance, her BP has been excellent in the last few months. Psoriasis, still uses clobetasol from time to time. Anxiety, insomnia: Very rarely takes half clonazepam a day as needed. Occasional back pain, like a spasm, left from the thoracic spine, no radiation. Usually triggered by moving.  Past Medical History:  Hyperlipidemia  Hypertension  CV:  --Coronary artery disease, stress test 2004 "fix defect"  --CP-dizzines 3-13----> myoview showed a defect, cath negative  --Cerebrovascular accident 2000, CAROTID ENDARTERECTOMY, RIGHT, -- due to trauma  Allergic rhinitis- Skin test 04/20/08  Recurrent rhinosinusitis  Several pneumonias and otitis as a child.  Hemorrhoid  h/o pernicious anemia  psoriasis (2010)  osteopenia, DEXA 4-10 , started Fosamax    Past Surgical History:  Cholecystectomy  tubal ligation  Hysterectomy, no oophorectomy  Salivary gland & LN resection 2002 ,Dr Ezzard Standing  CAROTID  ENDARTERECTOMY, RIGHT 2,000 -- due to trauma  Appendectomy  12-10 extensive cervical spine surgery, decompression, allograft placement  11-2009 R shoulder surgery, rotator cuff repair, bicipital tendin repair (Dr Debby Bud)   Family History:  Hypertension-- M  hyperlipidema--M  CAD--F , brother CABG age 39  parkinson--F  breast ca--no  colon ca--no   Social History:  Remarried, lives at [place], [place] w/ husband , 3 children  Former Smoker-1994  Alcohol use- very rarely  Diet: does watch  Review of Systems Denies fever or chills. No cough. No chest pain or shortness of breath No nausea, vomiting, diarrhea or blood in the stools. No dysuria or gross hematuria.     Objective:   Physical Exam General -- alert, well-developed, and well-nourished.   Neck --no thyromegaly  Breasts--  Right breast : normal to inspection, the upper external quadrant seems more dense although I cannot feel a specific mass. No axillary LAD Left breast: No mass, nodules, thickening, tenderness, bulging, retraction, inflamation, nipple discharge or skin changes noted.  no axillary lymph nodes Lungs -- normal respiratory effort, no intercostal  retractions, no accessory muscle use, and normal breath sounds.   Heart-- normal rate, regular rhythm, no murmur, and no gallop.   Abdomen--soft, non-tender, no distention, no masses, no HSM, no guarding, and no rigidity.   Extremities-- no pretibial edema bilaterally Back-- no tender to palpation of the thoracic spine. Psych-- Cognition and judgment appear intact. Alert and cooperative with normal attention span and concentration.  not anxious appearing and not depressed appearing.       Assessment & Plan:  Occasional back pain, if not better will call for x-ray No urinary symptoms, you did some leukocytes, urine culture sent

## 2012-03-17 NOTE — Patient Instructions (Addendum)

## 2012-03-17 NOTE — Assessment & Plan Note (Signed)
Well controlled 

## 2012-03-17 NOTE — Assessment & Plan Note (Addendum)
Check vitamin B12 and CBC. On shots

## 2012-03-18 ENCOUNTER — Encounter: Payer: Self-pay | Admitting: Internal Medicine

## 2012-03-18 LAB — URINE CULTURE

## 2012-03-20 ENCOUNTER — Encounter: Payer: Self-pay | Admitting: *Deleted

## 2012-03-26 LAB — HM MAMMOGRAPHY

## 2012-03-28 ENCOUNTER — Telehealth: Payer: Self-pay | Admitting: Internal Medicine

## 2012-03-28 NOTE — Telephone Encounter (Signed)
Thanks

## 2012-03-28 NOTE — Telephone Encounter (Signed)
Mammogram normal. Based on the clinical exam I order a mammogram and a right breast ultrasound. Please schedule right breast ultrasound, apparently was not done, DX abnormal breast exam

## 2012-03-28 NOTE — Telephone Encounter (Signed)
Discussed with pt, she stated that US of the right breast was done. I called [facility] to check & they stated they do no have results of the [place] I called pt to let her know that she will need to call [facility] to schedule another [place]. Pt stated she will call to try & schedule something next week.

## 2012-04-21 ENCOUNTER — Encounter: Payer: Self-pay | Admitting: Internal Medicine

## 2012-04-24 ENCOUNTER — Telehealth: Payer: Self-pay | Admitting: Internal Medicine

## 2012-04-24 NOTE — Telephone Encounter (Signed)
Patient states she was supposed to have a breast ultrasound, but the imaging dept did the wrong test. Patient states we were supposed to get this straightened out and call back. Please call pt back at [phone]

## 2012-04-24 NOTE — Telephone Encounter (Signed)
Originally, Dr. Drue Novel placed order for this patient to have a screening mammogram, and a right breast u/s with [facility].  I completed [facility]'s referral form ordering exactly above.  When patient arrived to [facility] to check in, it was offered to her to have a 3D mammogram, and patient signed which confirmed she wanted to have the 3D done.  Patient states she thought she was signing for the regular mammogram.  Per my call to [facility]/Client Relations Specialist, she researched this issue and her staff states that they explained to patient by signing that she was agreeing to the 3D.  They believe patient may have just gotten confused, because it was clearly offered to her.  Also, per Victorino Dike, by patient having the 3D, this took away the need for the additional requested test of right breast ultrasound, and that if patient would have had the regular mammogram and then the u/s, her cost would have been greater than what she is disputing now.  I have called patient, explained to her all above, however patient is very upset, states she is contacting her insurance company to tell them not to pay the claim.  Lastly, patient wants Dr. Drue Novel to be aware of all above, and is asking does he still want her to have u/s of her right breast, even though 3D took place of it.  Please advise.

## 2012-04-25 NOTE — Telephone Encounter (Signed)
Advise patient, it to prepare for the allergies, in addition to her regular mammograms she had a 3-D mammogram which is  a more detailed exam. Is felt that her results are completely normal. Given results, I don't think any to pursue a ultrasound, I do recommend her to come back in 3- 4 months to redo her breast exam.

## 2012-04-28 NOTE — Telephone Encounter (Signed)
lmovm for pt to return call.  

## 2012-04-29 NOTE — Telephone Encounter (Signed)
Discussed with pt

## 2012-04-30 ENCOUNTER — Telehealth: Payer: Self-pay | Admitting: Internal Medicine

## 2012-04-30 NOTE — Telephone Encounter (Signed)
Ok to refill? Last OV 1.13.14

## 2012-05-01 NOTE — Telephone Encounter (Signed)
done

## 2012-05-22 ENCOUNTER — Encounter: Payer: Self-pay | Admitting: Cardiology

## 2012-05-22 ENCOUNTER — Ambulatory Visit (INDEPENDENT_AMBULATORY_CARE_PROVIDER_SITE_OTHER): Payer: Medicare Other | Admitting: Cardiology

## 2012-05-22 DIAGNOSIS — E785 Hyperlipidemia, unspecified: Secondary | ICD-10-CM

## 2012-05-22 DIAGNOSIS — Z9889 Other specified postprocedural states: Secondary | ICD-10-CM

## 2012-05-22 DIAGNOSIS — I6529 Occlusion and stenosis of unspecified carotid artery: Secondary | ICD-10-CM

## 2012-05-22 DIAGNOSIS — I10 Essential (primary) hypertension: Secondary | ICD-10-CM

## 2012-05-22 NOTE — Assessment & Plan Note (Signed)
She is stable in a systematic. We'll repeat carotid Dopplers in August. I will then turned over to Dr.McAlhaney. She says she will somebody fun!!!

## 2012-05-22 NOTE — Patient Instructions (Addendum)
Your physician has requested that you have a carotid duplex in August. This test is an ultrasound of the carotid arteries in your neck. It looks at blood flow through these arteries that supply the brain with blood. Allow one hour for this exam. There are no restrictions or special instructions.  Your physician recommends that you schedule a follow-up appointment in: August with Dr. Daleen Squibb same day as Carotid ultrasound  Your physician recommends that you continue on your current medications as directed. Please refer to the Current Medication list given to you today.
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
## 2012-05-23 ENCOUNTER — Other Ambulatory Visit: Payer: Self-pay

## 2012-06-26 ENCOUNTER — Other Ambulatory Visit: Payer: Self-pay | Admitting: Internal Medicine

## 2012-06-26 NOTE — Telephone Encounter (Signed)
Refill done.  

## 2012-07-01 ENCOUNTER — Other Ambulatory Visit: Payer: Self-pay | Admitting: Internal Medicine

## 2012-07-01 NOTE — Telephone Encounter (Signed)
Refill done.  

## 2012-08-22 ENCOUNTER — Telehealth: Payer: Self-pay | Admitting: Cardiology

## 2012-08-22 NOTE — Telephone Encounter (Signed)
New Problem:    Patient called in wanting to schedule na appointment to see Dr. Daleen Squibb before he left.  Please call back.

## 2012-08-22 NOTE — Telephone Encounter (Signed)
I have spoken with pt & Dr. Daleen Squibb.  She agrees to see Tereso Newcomer PA same day Dr. Daleen Squibb is in the office. She will also have her carotid duplex same day as per 05/2012 office note with Dr. Lonia Chimera RN

## 2012-08-25 ENCOUNTER — Other Ambulatory Visit: Payer: Self-pay | Admitting: *Deleted

## 2012-08-25 DIAGNOSIS — E538 Deficiency of other specified B group vitamins: Secondary | ICD-10-CM

## 2012-08-25 MED ORDER — CYANOCOBALAMIN 1000 MCG/ML IJ SOLN: 1000.0000 ug | INTRAMUSCULAR | Status: DC

## 2012-08-25 NOTE — Telephone Encounter (Signed)
Pt left message on triage requesting that B-12 be called to new pharmacy; [facility] in [place]. This was done.

## 2012-09-15 ENCOUNTER — Ambulatory Visit: Payer: Medicare Other | Admitting: Internal Medicine

## 2012-09-16 ENCOUNTER — Other Ambulatory Visit: Payer: Self-pay | Admitting: Internal Medicine

## 2012-09-16 NOTE — Telephone Encounter (Signed)
Spoke with Dr. Drue Novel, verbal orders received ok to give patient 3 month supply and request OV. Orders enacted. Refill done.

## 2012-10-20 ENCOUNTER — Ambulatory Visit (INDEPENDENT_AMBULATORY_CARE_PROVIDER_SITE_OTHER): Payer: PRIVATE HEALTH INSURANCE | Admitting: Internal Medicine

## 2012-10-20 ENCOUNTER — Encounter: Payer: Self-pay | Admitting: Internal Medicine

## 2012-10-20 DIAGNOSIS — D510 Vitamin B12 deficiency anemia due to intrinsic factor deficiency: Secondary | ICD-10-CM

## 2012-10-20 DIAGNOSIS — I6529 Occlusion and stenosis of unspecified carotid artery: Secondary | ICD-10-CM

## 2012-10-20 DIAGNOSIS — R946 Abnormal results of thyroid function studies: Secondary | ICD-10-CM

## 2012-10-20 DIAGNOSIS — K219 Gastro-esophageal reflux disease without esophagitis: Secondary | ICD-10-CM

## 2012-10-20 DIAGNOSIS — E785 Hyperlipidemia, unspecified: Secondary | ICD-10-CM

## 2012-10-20 DIAGNOSIS — F411 Generalized anxiety disorder: Secondary | ICD-10-CM

## 2012-10-20 DIAGNOSIS — I10 Essential (primary) hypertension: Secondary | ICD-10-CM

## 2012-10-20 LAB — CBC WITH DIFFERENTIAL/PLATELET
Eosinophils Relative: 3 % (ref 0.0–5.0)
Lymphocytes Relative: 31.8 % (ref 12.0–46.0)
MCV: 87.3 fl (ref 78.0–100.0)
Monocytes Absolute: 0.5 10*3/uL (ref 0.1–1.0)
Neutrophils Relative %: 55.5 % (ref 43.0–77.0)
Platelets: 148 10*3/uL — ABNORMAL LOW (ref 150.0–400.0)
WBC: 5.5 10*3/uL (ref 4.5–10.5)

## 2012-10-20 LAB — ALT: ALT: 16 U/L (ref 0–35)

## 2012-10-20 LAB — TSH: TSH: 2.77 u[IU]/mL (ref 0.35–5.50)

## 2012-10-20 LAB — BASIC METABOLIC PANEL
Calcium: 10 mg/dL (ref 8.4–10.5)
Creatinine, Ser: 1.1 mg/dL (ref 0.4–1.2)
GFR: 49.74 mL/min — ABNORMAL LOW (ref >60.00)

## 2012-10-20 LAB — AST: AST: 20 U/L (ref 0–37)

## 2012-10-20 LAB — T4, FREE: Free T4: 0.86 ng/dL (ref 0.60–1.60)

## 2012-10-20 MED ORDER — OMEPRAZOLE 40 MG PO CPDR: 40.0000 mg | DELAYED_RELEASE_CAPSULE | Freq: Every day | ORAL | Status: DC

## 2012-10-20 NOTE — Assessment & Plan Note (Signed)
Last B12 normal ~ 03-2012, recommend to continue with monthly B12 shots

## 2012-10-20 NOTE — Assessment & Plan Note (Signed)
Due for a carotid ultrasound 11-2012. Thinking about moving her medical care to [facility], wonders if she needs a cardiologist. I think that if the carotid ultrasound  is stable on 11-2012, it will be okay to followup with PCP only.  She had normal cardiac catheterization last year.

## 2012-10-20 NOTE — Assessment & Plan Note (Addendum)
New onset of GERD symptoms in a 78 year old lady with dysphagia twice a week. Plan:  Omeprazole 40 mg daily Refer to GI, EGD?Marland Kitchen

## 2012-10-20 NOTE — Progress Notes (Signed)
  Subjective:    Patient ID: Tammy [name], female    DOB: 12-18-1934, 78 y.o.   MRN: [id number]  HPI Routine office visit Anxiety, very rarely takes Clonopin. Hypertension, good medication compliance, reports normal ambulatory BPs. Today she also reports heartburn for the last few months, and this is a new symptom for her, she also noted dysphagia to solids but not to liquids, on average has dysphagia 2 times a week, it does not happen every time she ingest solids.    Past Medical History:   Hyperlipidemia   Hypertension   CV:   --Coronary artery disease, stress test 2004 "fix defect"   --CP-dizzines 3-13----> myoview showed a defect, cath negative   --Cerebrovascular accident 2000, CAROTID ENDARTERECTOMY, RIGHT, -- due to trauma   Allergic rhinitis- Skin test 04/20/08   Recurrent rhinosinusitis   Several pneumonias and otitis as a child.   Hemorrhoid   h/o pernicious anemia   psoriasis (2010)   osteopenia, DEXA 4-10 , started Fosamax    Past Surgical History:   Cholecystectomy   tubal ligation   Hysterectomy, no oophorectomy   Salivary gland & LN resection 2002 ,Dr Ezzard Standing   CAROTID ENDARTERECTOMY, RIGHT 2,000 -- due to trauma   Appendectomy   12-10 extensive cervical spine surgery, decompression, allograft placement   11-2009 R shoulder surgery, rotator cuff repair, bicipital tendin repair (Dr Debby Bud)    Family History:   Hypertension-- M   hyperlipidema--M   CAD--F , brother CABG age 97   parkinson--F   breast ca--no   colon ca--no   Social History:   Remarried, lives at [place], [place] w/ husband , 3 children   Former Smoker-1994   Alcohol use- very rarely   Diet: does watch    Review of Systems No chest pain, shortness or breath or palpitations. No nausea, vomiting, diarrhea. No weight loss, fever, cough with food intake.     Objective:   Physical Exam BP 140/70  Pulse 65  Temp(Src) 98 F (36.7 C)  Wt 158 lb 12.8 oz (72.031 kg)  BMI 26.43 kg/m2  SpO2  98%  General -- alert, well-developed, NAD.  Neck --no thyromegaly , no  LAD HEENT-- Not pale.  Lungs -- normal respiratory effort, no intercostal retractions, no accessory muscle use, and normal breath sounds.  Heart-- normal rate, regular rhythm, no murmur.  Abdomen-- Not distended, Good bowel sounds,soft, non-tender.  Extremities-- no pretibial edema bilaterally  Neurologic-- alert & oriented X3. Speech, gait normal.  Psych-- Cognition and judgment appear intact. Alert and cooperative with normal attention span and concentration. not anxious appearing and not depressed appearing.      Assessment & Plan:

## 2012-10-20 NOTE — Assessment & Plan Note (Signed)
On clonazepam which she takes rarely

## 2012-10-20 NOTE — Assessment & Plan Note (Signed)
Controlled, check LFTs

## 2012-10-20 NOTE — Assessment & Plan Note (Signed)
Well-controlled, check BMP  

## 2012-10-20 NOTE — Patient Instructions (Addendum)
Get your blood work before you leave  Next visit in 6 months for a physical exam   Please make an appointment before you leave the office today (or call few weeks in advance)     

## 2012-10-20 NOTE — Assessment & Plan Note (Signed)
TSH was slightly elevated once in 2013, subsequent TSH normal. Plan: Labs today

## 2012-10-21 ENCOUNTER — Encounter: Payer: Self-pay | Admitting: Internal Medicine

## 2012-10-21 ENCOUNTER — Encounter: Payer: Self-pay | Admitting: Gastroenterology

## 2012-10-22 ENCOUNTER — Encounter: Payer: Self-pay | Admitting: *Deleted

## 2012-10-22 ENCOUNTER — Encounter (INDEPENDENT_AMBULATORY_CARE_PROVIDER_SITE_OTHER): Payer: Medicare Other

## 2012-10-22 ENCOUNTER — Encounter: Payer: Self-pay | Admitting: Physician Assistant

## 2012-10-22 ENCOUNTER — Ambulatory Visit (INDEPENDENT_AMBULATORY_CARE_PROVIDER_SITE_OTHER): Payer: Medicare Other | Admitting: Physician Assistant

## 2012-10-22 DIAGNOSIS — E785 Hyperlipidemia, unspecified: Secondary | ICD-10-CM

## 2012-10-22 DIAGNOSIS — I6529 Occlusion and stenosis of unspecified carotid artery: Secondary | ICD-10-CM

## 2012-10-22 DIAGNOSIS — I10 Essential (primary) hypertension: Secondary | ICD-10-CM

## 2012-10-22 DIAGNOSIS — I2510 Atherosclerotic heart disease of native coronary artery without angina pectoris: Secondary | ICD-10-CM
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
## 2012-10-22 NOTE — Patient Instructions (Addendum)
NO CHANGES WERE MADE TODAY  PLEASE FOLLOW UP WITH DR. Clifton James IN 1 YEAR

## 2012-11-18 ENCOUNTER — Ambulatory Visit: Payer: Medicare Other | Admitting: Gastroenterology

## 2012-11-19 ENCOUNTER — Encounter: Payer: Self-pay | Admitting: Internal Medicine

## 2012-11-19 ENCOUNTER — Other Ambulatory Visit: Payer: Self-pay | Admitting: *Deleted

## 2012-11-19 MED ORDER — OMEPRAZOLE 40 MG PO CPDR: 40.0000 mg | DELAYED_RELEASE_CAPSULE | Freq: Every day | ORAL | Status: DC

## 2012-12-17 ENCOUNTER — Ambulatory Visit: Payer: Medicare Other | Admitting: Gastroenterology

## 2012-12-22 ENCOUNTER — Other Ambulatory Visit: Payer: Self-pay | Admitting: Internal Medicine

## 2012-12-23 ENCOUNTER — Telehealth: Payer: Self-pay | Admitting: *Deleted

## 2012-12-23 NOTE — Telephone Encounter (Signed)
Refill for apresoline sent to [facility] in [facility]

## 2012-12-25 ENCOUNTER — Ambulatory Visit: Payer: Medicare Other | Admitting: Gastroenterology

## 2013-01-03 HISTORY — PX: OTHER SURGICAL HISTORY: SHX169

## 2013-02-01 ENCOUNTER — Other Ambulatory Visit: Payer: Self-pay | Admitting: Internal Medicine

## 2013-02-02 NOTE — Telephone Encounter (Signed)
Losartan refilled per protocol 

## 2013-02-04 ENCOUNTER — Other Ambulatory Visit: Payer: Self-pay | Admitting: Internal Medicine

## 2013-02-04 NOTE — Telephone Encounter (Signed)
Crestor refilled per protocol.

## 2013-02-10 ENCOUNTER — Encounter: Payer: Self-pay | Admitting: Internal Medicine

## 2013-02-10 ENCOUNTER — Ambulatory Visit (INDEPENDENT_AMBULATORY_CARE_PROVIDER_SITE_OTHER): Payer: Medicare Other | Admitting: Internal Medicine

## 2013-02-10 VITALS — BP 145/61 | HR 69 | Temp 97.7°F | Wt 161.0 lb

## 2013-02-10 DIAGNOSIS — R109 Unspecified abdominal pain: Secondary | ICD-10-CM

## 2013-02-10 NOTE — Progress Notes (Signed)
Pre visit review using our clinic review tool, if applicable. No additional management support is needed unless otherwise documented below in the visit note. 

## 2013-02-10 NOTE — Progress Notes (Signed)
Subjective:    Patient ID: Tammy [name], female    DOB: Dec 03, 1934, 78 y.o.   MRN: [id number]  HPI Chief complaint is abdominal pain She is having abdominal pain since approximately 01/26/2013, Located mostly at the upper abdomen going around to the sides and to her back. Also pain at the L abdomen On 01/30/2013 she went to see a local doctor, he felt she probably had pancreatitis or  diverticulitis and was referred to the ER. In the ER ([facility], [place]) she had an abdominal CT, blood tests and a urinalysis, was diagnosed with constipation and prescribe MiraLax. pt did take the MiraLax,   did not feel anything different, eventually went to see another provider 02/03/2013 and was prescribed linzess for the diagnosis of constipation, she took for 2 days and developed diarrhea. Eventually she stopped lynses and is gradually getting better, Now the pain is more like a mild soreness and mostly in the left lower quadrant. She continue with diarrhea, appetite is not completely back to normal.    Past Medical History:   Hyperlipidemia   Hypertension   CV:   --Coronary artery disease, stress test 2004 "fix defect"   --CP-dizzines 3-13----> myoview showed a defect, cath negative   --Cerebrovascular accident 2000, CAROTID ENDARTERECTOMY, RIGHT, -- due to trauma   Allergic rhinitis- Skin test 04/20/08   Recurrent rhinosinusitis   Several pneumonias and otitis as a child.   Hemorrhoid   h/o pernicious anemia   psoriasis (2010)   osteopenia, DEXA 4-10 , started Fosamax    Past Surgical History:   Cholecystectomy   tubal ligation   Hysterectomy, no oophorectomy   Salivary gland & LN resection 2002 ,Dr Ezzard Standing   CAROTID ENDARTERECTOMY, RIGHT 2,000 -- due to trauma   Appendectomy   12-10 extensive cervical spine surgery, decompression, allograft placement   11-2009 R shoulder surgery, rotator cuff repair, bicipital tendin repair (Dr Debby Bud)    Family History:   Hypertension-- M     hyperlipidema--M   CAD--F , brother CABG age 6   parkinson--F   breast ca--no   colon ca--no   Social History:   Remarried, lives at [place], [place] w/ husband , 3 children   Former Smoker-1994   Alcohol use- very rarely   Diet: does watch   Review of Systems She denies vomiting but has some nausea with the onset of symptoms. She actually denies constipation, was having bowel movements regularly prior to the onset of symptoms. No blood in the stools. She was somehow distended when she went to the ER. No dysuria gross hematuria. No fevers.     Objective:   Physical Exam  Abdominal:      BP 145/61  Pulse 69  Temp(Src) 97.7 F (36.5 C)  Wt 161 lb (73.029 kg)  SpO2 93% General -- alert, well-developed, NAD.   HEENT-- Not pale.   Lungs -- normal respiratory effort, no intercostal retractions, no accessory muscle use, and normal breath sounds.  Heart-- normal rate, regular rhythm, no murmur.  Abdomen-- Not distended, increased  bowel sounds,soft, mild tendeness @ the left w/o  Rigidity, mass,or organomegaly. Rectal-- Normal sphincter tone. No rectal masses or tenderness. No impaction, no stools found, no mucus or blood  Extremities-- no pretibial edema bilaterally  Neurologic--  alert & oriented X3. Speech normal, gait normal, strength normal in all extremities.  Psych-- Cognition and judgment appear intact. Cooperative with normal attention span and concentration. No anxious appearing , no depressed appearing.  Assessment & Plan:   Resolving abdominal pain, Resolving abdominal pain, etiology unclear, CT of the emergency room apparently consistent with constipation. At this time the patient is actually having diarrhea probably due to to linzess Plan: Gradually  back to normal diet Probiotics Get records

## 2013-02-10 NOTE — Patient Instructions (Signed)
Bland diet for few days then go back to a normal diet. Get  over-the-counter probiotics, ALIGN is one of them, one  a day. We'll get records from the ER visit. If you have more symptoms or are  not completely well in 2 weeks --- let us  known.

## 2013-02-11 ENCOUNTER — Telehealth: Payer: Self-pay | Admitting: Internal Medicine

## 2013-02-11 NOTE — Telephone Encounter (Signed)
ER records reviewed, was seen 01-30-13: CBC normal, creatinine 1.2, lipase, LFTs normal. Urinalysis showed trace leukocytes, no WBCs, 1+ bacteria. CT without contrast showed no stones, + constipation and diverticulosis.  Advise patient: I review the records, plan is the same, if she is not improving let me know. Also if she develops any urinary symptoms such as burning with urination, change in the color of the urine--->  let me know

## 2013-02-12 NOTE — Telephone Encounter (Signed)
Pt notified. DJR  

## 2013-03-05 HISTORY — PX: FRONTALIS SUSPENSION: SHX1688

## 2013-03-18 ENCOUNTER — Telehealth: Payer: Self-pay

## 2013-03-18 NOTE — Telephone Encounter (Signed)
Medication and allergies:  Reviewed and updated  90 day supply/mail order: n/a Local pharmacy:  [facility] [place], [place]   Immunizations due:  UTD   A/P: No changes to personal, family history or past surgical hx PAP-02/20/10-norm CCS-12/04/10-adenomatous-recommended every 5 years MMG-03/26/12-stable BD-03/09/11-recommended every 3 years. Flu-12/2012 Tdap-1/131/14 PNA-09/16/06 Shingles-12/2012  To Discuss with Provider:  Bone density test  L. wrist ganglion cyst-has gotten larger and hurts at night

## 2013-03-20 ENCOUNTER — Encounter: Payer: Self-pay | Admitting: Internal Medicine

## 2013-03-20 ENCOUNTER — Ambulatory Visit (INDEPENDENT_AMBULATORY_CARE_PROVIDER_SITE_OTHER): Payer: Medicare Other | Admitting: Internal Medicine

## 2013-03-20 VITALS — BP 130/64 | HR 66 | Temp 98.2°F | Ht 65.8 in | Wt 162.0 lb

## 2013-03-20 DIAGNOSIS — F411 Generalized anxiety disorder: Secondary | ICD-10-CM

## 2013-03-20 DIAGNOSIS — M899 Disorder of bone, unspecified: Secondary | ICD-10-CM

## 2013-03-20 DIAGNOSIS — E785 Hyperlipidemia, unspecified: Secondary | ICD-10-CM

## 2013-03-20 DIAGNOSIS — D510 Vitamin B12 deficiency anemia due to intrinsic factor deficiency: Secondary | ICD-10-CM

## 2013-03-20 DIAGNOSIS — M949 Disorder of cartilage, unspecified: Secondary | ICD-10-CM

## 2013-03-20 DIAGNOSIS — I10 Essential (primary) hypertension: Secondary | ICD-10-CM

## 2013-03-20 DIAGNOSIS — Z0000 Encounter for general adult medical examination without abnormal findings: Secondary | ICD-10-CM

## 2013-03-20 DIAGNOSIS — M7130 Other bursal cyst, unspecified site: Secondary | ICD-10-CM

## 2013-03-20 DIAGNOSIS — I6529 Occlusion and stenosis of unspecified carotid artery: Secondary | ICD-10-CM

## 2013-03-20 LAB — ALT: ALT: 17 U/L (ref 0–35)

## 2013-03-20 LAB — BASIC METABOLIC PANEL
BUN: 17 mg/dL (ref 6–23)
CALCIUM: 10.2 mg/dL (ref 8.4–10.5)
CO2: 28 mEq/L (ref 19–32)
CREATININE: 1.02 mg/dL (ref 0.50–1.10)
Chloride: 101 mEq/L (ref 96–112)
Glucose, Bld: 76 mg/dL (ref 70–99)
Potassium: 4.4 mEq/L (ref 3.5–5.3)
SODIUM: 141 meq/L (ref 135–145)

## 2013-03-20 LAB — LIPID PANEL
CHOL/HDL RATIO: 2.1 ratio
Cholesterol: 146 mg/dL (ref 0–200)
HDL: 70 mg/dL (ref >39)
LDL Cholesterol: 62 mg/dL (ref 0–99)
TRIGLYCERIDES: 69 mg/dL (ref <150)
VLDL: 14 mg/dL (ref 0–40)

## 2013-03-20 LAB — AST: AST: 22 U/L (ref 0–37)

## 2013-03-20 NOTE — Patient Instructions (Signed)
Get your blood work before you leave   Next visit is for routine check up regards   hypertension, cholesterol, in 6 months  No need to come back fasting Please make an appointment     Fall Prevention and [place] cause injuries and can affect all age groups. It is possible to use preventive measures to significantly decrease the likelihood of falls. There are many simple measures which can make your home safer and prevent falls. OUTDOORS  Repair cracks and edges of walkways and driveways.  Remove high doorway thresholds.  Trim shrubbery on the main path into your home.  Have good outside lighting.  Clear walkways of tools, rocks, debris, and clutter.  Check that handrails are not broken and are securely fastened. Both sides of steps should have handrails.  Have leaves, snow, and ice cleared regularly.  Use sand or salt on walkways during winter months.  In the garage, clean up grease or oil spills. BATHROOM  Install night lights.  Install grab bars by the toilet and in the tub and shower.  Use non-skid mats or decals in the tub or shower.  Place a plastic non-slip stool in the shower to sit on, if needed.  Keep floors dry and clean up all water on the floor immediately.  Remove soap buildup in the tub or shower on a regular basis.  Secure bath mats with non-slip, double-sided rug tape.  Remove throw rugs and tripping hazards from the floors. BEDROOMS  Install night lights.  Make sure a bedside light is easy to reach.  Do not use oversized bedding.  Keep a telephone by your bedside.  Have a firm chair with side arms to use for getting dressed.  Remove throw rugs and tripping hazards from the floor. KITCHEN  Keep handles on pots and pans turned toward the center of the stove. Use back burners when possible.  Clean up spills quickly and allow time for drying.  Avoid walking on wet floors.  Avoid hot utensils and knives.  Position shelves so  they are not too high or low.  Place commonly used objects within easy reach.  If necessary, use a sturdy step stool with a grab bar when reaching.  Keep electrical cables out of the way.  Do not use floor polish or wax that makes floors slippery. If you must use wax, use non-skid floor wax.  Remove throw rugs and tripping hazards from the floor. STAIRWAYS  Never leave objects on stairs.  Place handrails on both sides of stairways and use them. Fix any loose handrails. Make sure handrails on both sides of the stairways are as long as the stairs.  Check carpeting to make sure it is firmly attached along stairs. Make repairs to worn or loose carpet promptly.  Avoid placing throw rugs at the top or bottom of stairways, or properly secure the rug with carpet tape to prevent slippage. Get rid of throw rugs, if possible.  Have an electrician put in a light switch at the top and bottom of the stairs. OTHER FALL PREVENTION TIPS  Wear low-heel or rubber-soled shoes that are supportive and fit well. Wear closed toe shoes.  When using a stepladder, make sure it is fully opened and both spreaders are firmly locked. Do not climb a closed stepladder.  Add color or contrast paint or tape to grab bars and handrails in your home. Place contrasting color strips on first and last steps.  Learn and use mobility aids as needed.  Install an electrical emergency response system.  Turn on lights to avoid dark areas. Replace light bulbs that burn out immediately. Get light switches that glow.  Arrange furniture to create clear pathways. Keep furniture in the same place.  Firmly attach carpet with non-skid or double-sided tape.  Eliminate uneven floor surfaces.  Select a carpet pattern that does not visually hide the edge of steps.  Be aware of all pets. OTHER HOME SAFETY TIPS  Set the water temperature for 120 F (48.8 C).  Keep emergency numbers on or near the telephone.  Keep smoke  detectors on every level of the home and near sleeping areas. Document Released: 02/09/2002 Document Revised: 08/21/2011 Document Reviewed: 05/11/2011 [facility] Patient Information 2014 [place].

## 2013-03-20 NOTE — Assessment & Plan Note (Signed)
See previous entry, Next dexa 2016, continue calcium, vitamin D and stay active

## 2013-03-20 NOTE — Assessment & Plan Note (Signed)
currently doing great, hardly ever uses clonazepam

## 2013-03-20 NOTE — Assessment & Plan Note (Signed)
Recheck labs, on monthly B12 shots

## 2013-03-20 NOTE — Assessment & Plan Note (Signed)
Recheck labs 

## 2013-03-20 NOTE — Assessment & Plan Note (Addendum)
Cyst is now symptomatic, refer her to [place]

## 2013-03-20 NOTE — Assessment & Plan Note (Addendum)
Td 2014 Had a flu shot   pneumonia shot x 2  Had a shingles shot  hysterectmy years ago (d/t bleeding) , no oophorectomy, no h/o abnormal PAPs . Last PAP [phone] normal-----> no further Paps MMG ~ 03-2012 normal, , breast exam today norma: plan to check a MMG q 2 years  Cscope 2002 ----> repeat colonoscopy 12-2010,  6 mm polyp, next colonoscopy per GI (5 years per my nurse research of the chart)  diet exercise discussed

## 2013-03-20 NOTE — Progress Notes (Signed)
Pre visit review using our clinic review tool, if applicable. No additional management support is needed unless otherwise documented below in the visit note. 

## 2013-03-20 NOTE — Progress Notes (Signed)
Subjective:    Patient ID: Tammy [name], female    DOB: 07-15-1934, 79 y.o.   MRN: [id number]  HPI  Here for Medicare AWV: 1.         Risk factors based on Past M, S, F history: reviewed   2.         Physical Activities: very active, silver snickers  3.         Depression/mood: (-) screening  4.         Hearing: mild B tinnitus x years, no problems hearing   5.         ADL's: totally independent 6.         Fall Risk: low risk, no h/o falls  , see instructions  7.         Home Safety: does feel safe at home 8.         Height, weight, &visual acuity: see VS, s/p cataracts surgery, vision is great   9.         Counseling: yes, see below 10.       Labs ordered based on risk factors: yes 11.           Referral Coordination, if needed 12.           Care Plan-- see a/p 13.            Cognitive Assessment, motor skills, memory and balance seems stable ana appropriate    in addition, we discussed the following issues: R wrist ganglion cyst-has gotten larger and hurts at night  Cholesterol, good compliance with Crestor, no apparent side effects Anxiety--on clonazepam, takes it very rarely. Hypertension--good compliance of medication, BP today is great.  Past Medical History:   Hyperlipidemia   Hypertension   CV:   --Coronary artery disease, stress test 2004 "fix defect"   --CP-dizzines 3-13----> myoview showed a defect, cath negative   --Cerebrovascular accident 2000, CAROTID ENDARTERECTOMY, RIGHT, -- due to trauma   Allergic rhinitis- Skin test 04/20/08   Recurrent rhinosinusitis   Several pneumonias and otitis as a child.   Hemorrhoid   h/o pernicious anemia   psoriasis (2010)   osteopenia, DEXA 4-10 , started Fosamax    Past Surgical History:   Cholecystectomy   tubal ligation   Hysterectomy, no oophorectomy   Salivary gland & LN resection 2002 ,Dr Lucia Gaskins   CAROTID ENDARTERECTOMY, RIGHT 2,000 -- due to trauma   Appendectomy   12-10 extensive cervical spine surgery,  decompression, allograft placement   11-2009 R shoulder surgery, rotator cuff repair, bicipital tendin repair (Dr Linda Hedges)   Cataract surgery 11-2012 Eye lid surgery 01-2013  Family History:   Hypertension-- M   hyperlipidema--M   CAD--F , brother CABG age 31   parkinson--F   breast ca--no   colon ca--no   Social History:   Remarried, lives at [place], [place] w/ husband , 3 children  (2 in [place], one in [place]) Former Smoker-1994   Alcohol use- very rarely       Review of Systems No  CP, SOB, lower extremity edema Denies  nausea, vomiting diarrhea; constipation ok now Denies  blood in the stools (-) cough, sputum production, (-) wheezing, chest congestion No dysuria, gross hematuria, difficulty urinating         Objective:   Physical Exam BP 130/64  Pulse 66  Temp(Src) 98.2 F (36.8 C)  Ht 5' 5.8" (1.671 m)  Wt 162 lb (73.483 kg)  BMI 26.32 kg/m2  SpO2 94% General -- alert, well-developed, NAD.  Neck --no thyromegaly   Breast-- no dominant mass, skin and nipples normal to inspection on palpation, axillary areas without mass or lymphadenopathy Lungs -- normal respiratory effort, no intercostal retractions, no accessory muscle use, and normal breath sounds.  Heart-- normal rate, regular rhythm, no murmur.  Abdomen-- Not distended, good bowel sounds,soft, non-tender. Extremities-- no pretibial edema bilaterally ; R wrist has a firm, mobile, slt tender cyst ~1.5 cm Neurologic--  alert & oriented X3. Speech normal, gait normal, strength normal in all extremities.  Psych-- Cognition and judgment appear intact. Cooperative with normal attention span and concentration. No anxious or depressed appearing.      Assessment & Plan:

## 2013-03-20 NOTE — Assessment & Plan Note (Signed)
Carotid ultrasound 10-2012, stable, repeat in one year

## 2013-03-20 NOTE — Assessment & Plan Note (Signed)
Well-controlled, no check, check a BMP

## 2013-03-21 LAB — VITAMIN B12: Vitamin B-12: 897 pg/mL (ref 211–911)

## 2013-03-23 ENCOUNTER — Encounter: Payer: Self-pay | Admitting: *Deleted

## 2013-03-31 IMAGING — CR DG CHEST 2V
2 series · 2 of 2 positions shown · non-contrast
Comparison: 08/10/2010

CLINICAL DATA: Nausea, syncope, history TIA, hypertension, carotid
arterial disease

CHEST - 2 VIEW

[w chest pa]
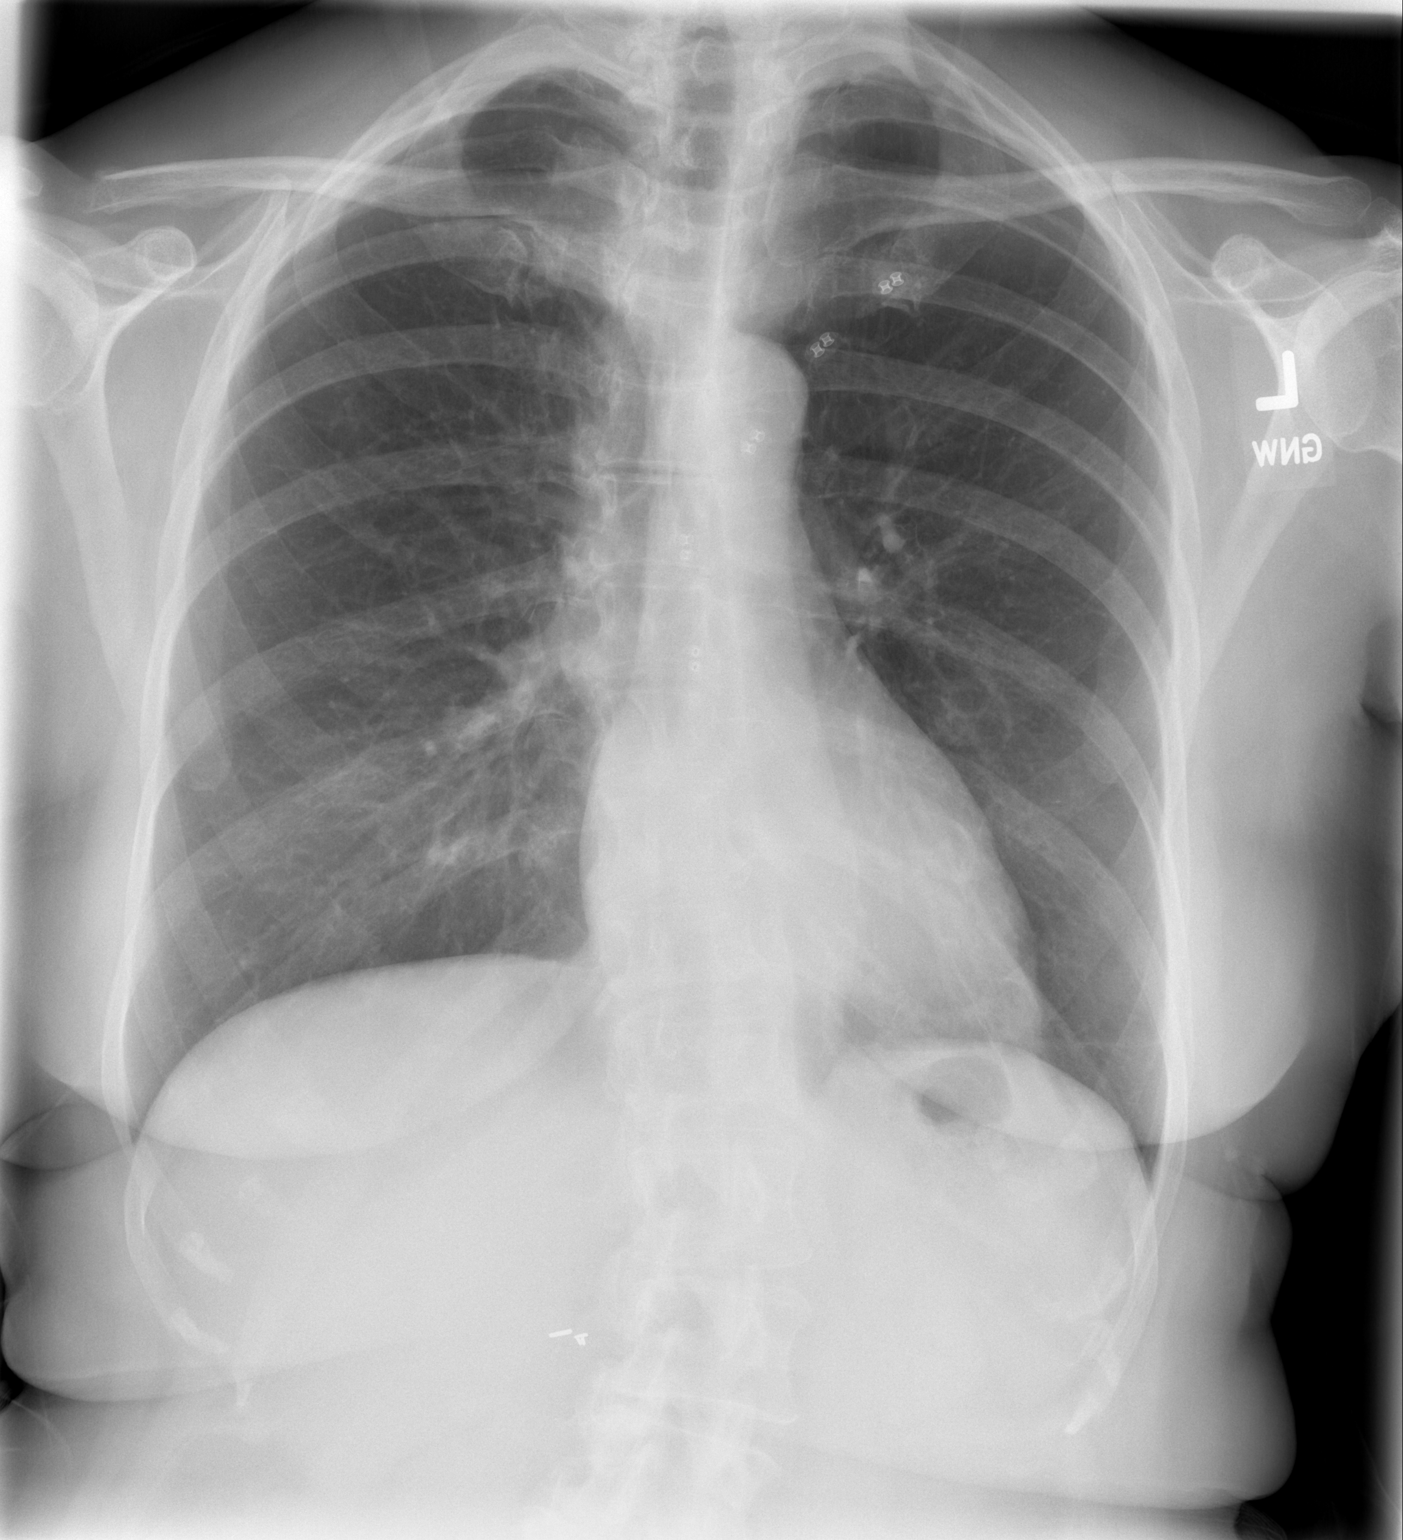

[w chest lat]
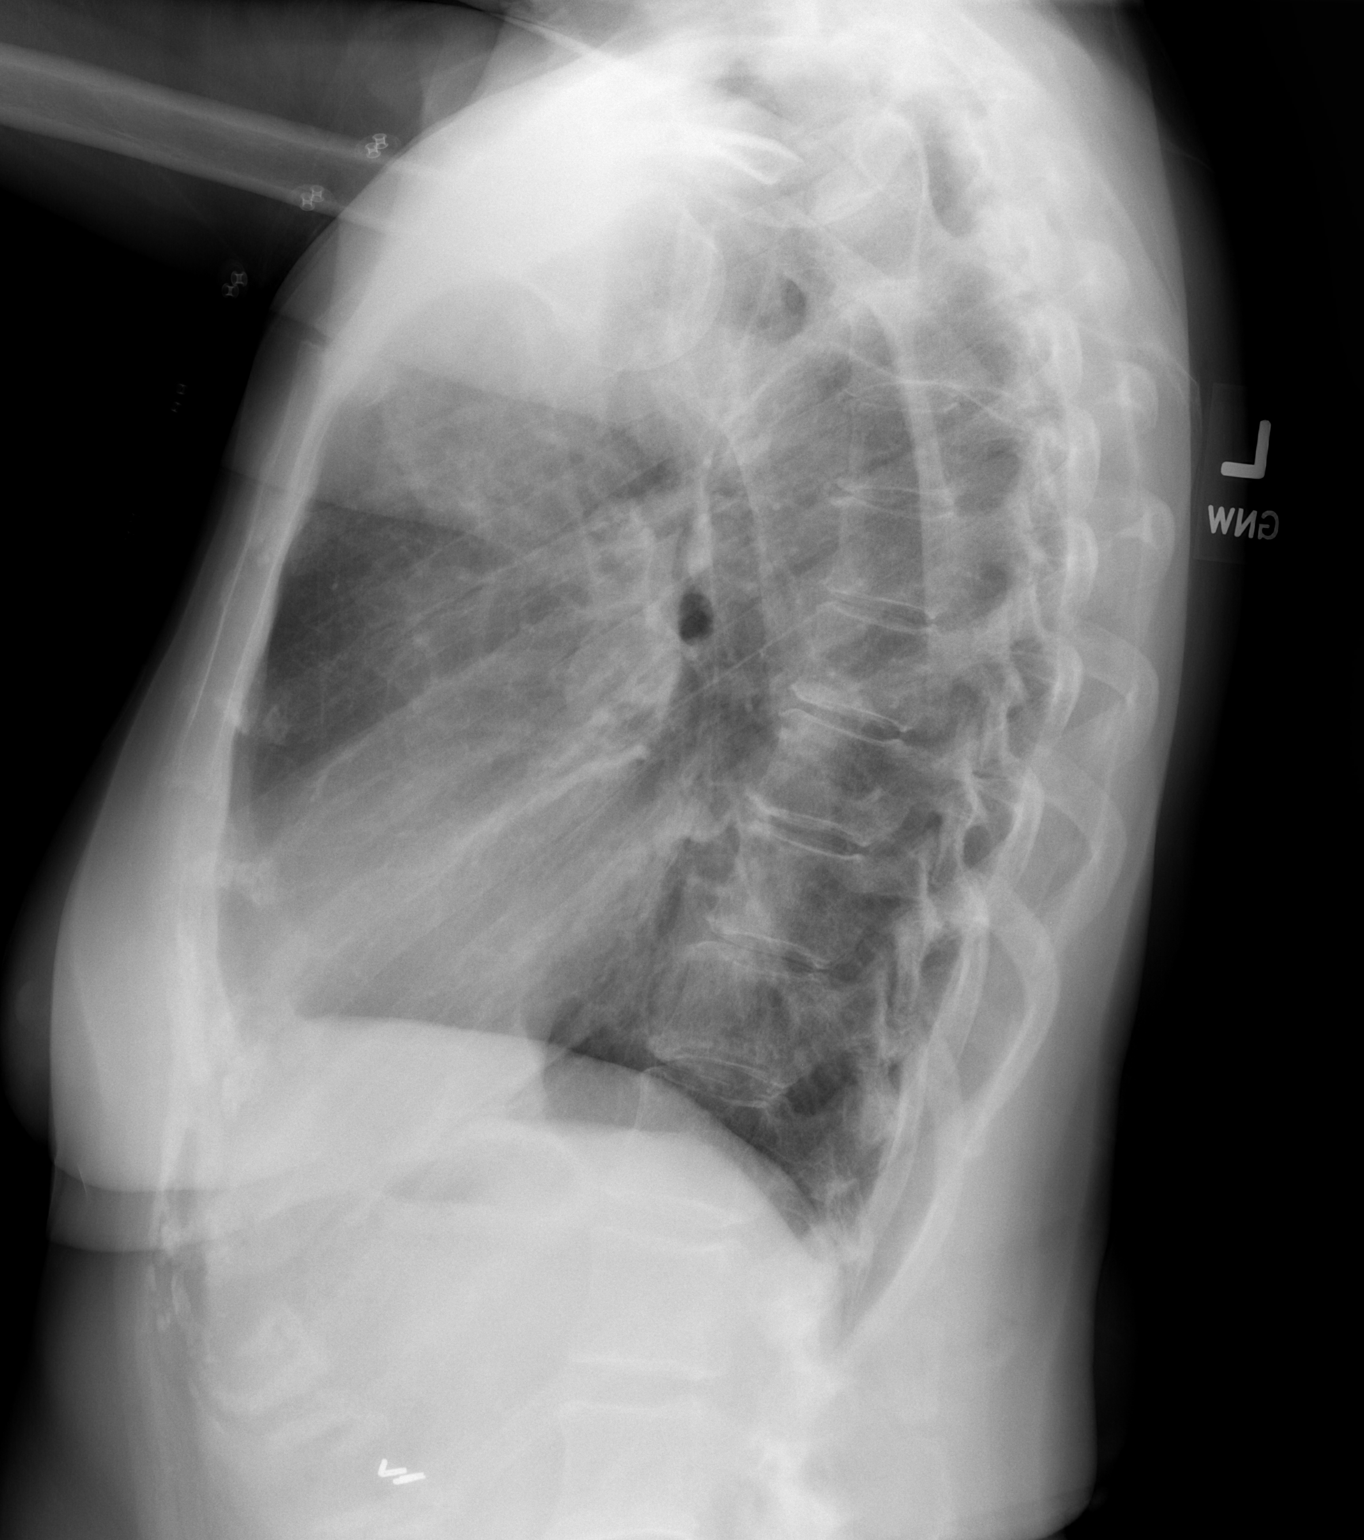

[2 of 2 positions shown; findings below may reference images not displayed]

FINDINGS: Normal heart size, mediastinal contours, and pulmonary vascularity.
Emphysematous changes without infiltrate or effusion.
Minimal linear subsegmental atelectasis versus scarring left base.
Clothing artifacts project over left upper lobe.
No pneumothorax.
Osseous demineralization with thoracolumbar scoliosis.
IMPRESSION: Emphysematous changes with minimal atelectasis or scarring left
base.
No acute abnormalities.

## 2013-04-04 ENCOUNTER — Other Ambulatory Visit: Payer: Self-pay | Admitting: Internal Medicine

## 2013-04-06 NOTE — Telephone Encounter (Signed)
Hydralazine refilled per protocol. JG//CMA

## 2013-04-08 ENCOUNTER — Telehealth: Payer: Self-pay | Admitting: Internal Medicine

## 2013-04-08 NOTE — Telephone Encounter (Signed)
Relevant patient education mailed to patient.  

## 2013-04-21 IMAGING — CR DG CHEST 1V PORT
1 series · 1 of 1 positions shown · non-contrast
Comparison: 02/13/2012

CLINICAL DATA: Shortness of breath.  Dyspnea.

PORTABLE CHEST - 1 VIEW

[view not recorded]
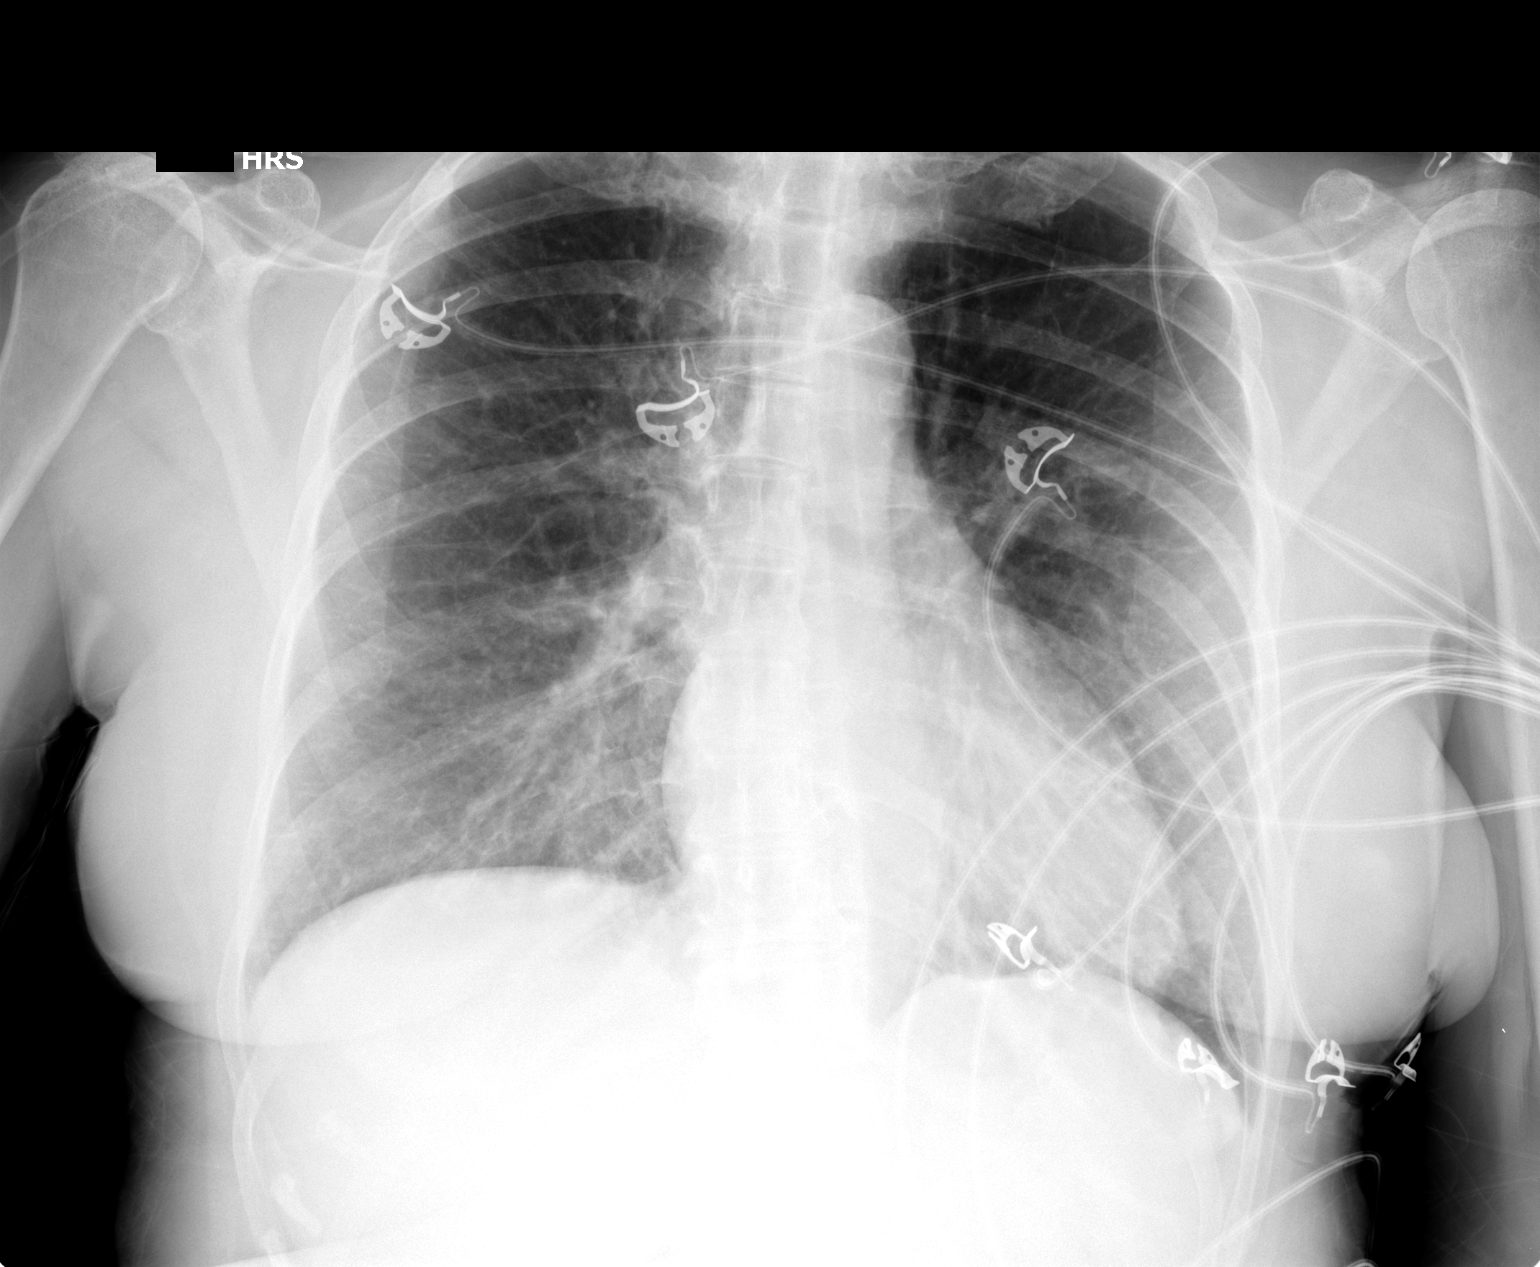

[1 of 1 positions shown; findings below may reference images not displayed]

FINDINGS: 3333 hours. The lungs are clear without focal infiltrate,
edema, pneumothorax or pleural effusion. Interstitial markings are
diffusely coarsened with chronic features. Cardiopericardial
silhouette is at upper limits of normal for size. Telemetry leads
overlie the chest.
IMPRESSION: No evidence for focal consolidation nor overt pulmonary edema.

## 2013-05-27 ENCOUNTER — Encounter: Payer: Self-pay | Admitting: Internal Medicine

## 2013-05-27 ENCOUNTER — Ambulatory Visit (INDEPENDENT_AMBULATORY_CARE_PROVIDER_SITE_OTHER): Payer: Medicare Other | Admitting: Internal Medicine

## 2013-05-27 VITALS — BP 159/83 | HR 90 | Temp 98.0°F | Wt 165.0 lb

## 2013-05-27 DIAGNOSIS — K529 Noninfective gastroenteritis and colitis, unspecified: Secondary | ICD-10-CM

## 2013-05-27 DIAGNOSIS — K5289 Other specified noninfective gastroenteritis and colitis: Secondary | ICD-10-CM

## 2013-05-27 DIAGNOSIS — I10 Essential (primary) hypertension: Secondary | ICD-10-CM

## 2013-05-27 NOTE — Patient Instructions (Addendum)
Advance your diet to normal as tolerated Call anytime if your symptoms resurface   Check the  blood pressure 2 or 3 times a  week be sure it is between 110/60 and 140/85. Ideal blood pressure is 120/80. If it is consistently higher or lower, let me know   Next visit by 09-2013

## 2013-05-27 NOTE — Progress Notes (Signed)
Pre visit review using our clinic review tool, if applicable. No additional management support is needed unless otherwise documented below in the visit note. 

## 2013-05-27 NOTE — Progress Notes (Signed)
Subjective:    Patient ID: Tammy [name], female    DOB: [birth year], 79 y.o.   MRN: [id number]  DOS:  05/27/2013 Type of  visit: ER f/u, was seen 05-20-13, note reviewed, see below : 79 year old female who presents emergency department today with nausea, vomiting, diarrhea, syncope less started last night. On examination, patient is afebrile, actively vomiting, regular rhythm, lungs are clear, abdomen benign with hyperactive bowel sounds, nonfocal neurologic examination. Differential diagnosis: Highly suspect gastroenteritis with syncope secondary to vasovagal episode and dehydration/orthostasis as occurred upon standing, doubtful cardiac origin, not clinically pulmonary embolism, stroke or other dangerous causes of syncope. There are no clinical signs of obstruction, cholecystitis, patient is status post appendectomy, do not suspect urinary tract infection. We'll check CBC, CMP, UA, given IV fluids, IV antibiotics, EKG, chest x-ray, orthostatic vital signs, 2 L normal saline bolus. Disposition pending at this time but hopeful for discharge if feeling better, tolerating by mouth. IV access was an issue, I placed a left EJ  EKG: Sinus rhythm at 78, borderline left axis deviation, normal intervals, no ST elevation or ST depression, T waves normal. Impression borderline left axis deviation but otherwise unremarkable EKG  Patient's labs are all unremarkable. Her UA shows no evidence of UTI. She was given IM Phenergan IV Zofran and she is able tolerate some sips of water but she still felt nauseous. Given IV Reglan and Benadryl and her nausea is improved that she is not vomited the emergency department. She has continued to have copious diarrhea however. She is given a dose of Lomotil. She is given 2 L of IV fluids. I do not feel that she requires inpatient admission at this time. She is stable for discharge and will follow up with her primary care physician tomorrow.  ROS Since she left the ER, she is  gradually getting better. No fever or chills Appetite is still decreased but she is tolerating well a bland diet. Diarrhea has been gone for several days. Never had blood in the stools. BP today is a slightly elevated at 159/83, no recent ambulatory BPs, creatinine at the ER was normal   Past Surgical History:   Cholecystectomy   tubal ligation   Hysterectomy, no oophorectomy   Salivary gland & LN resection 2002 ,Dr Lucia Gaskins   CAROTID ENDARTERECTOMY, RIGHT 2,000 -- due to trauma   Appendectomy   12-10 extensive cervical spine surgery, decompression, allograft placement   11-2009 R shoulder surgery, rotator cuff repair, bicipital tendin repair (Dr Linda Hedges)   Cataract surgery 11-2012 Eye lid surgery 01-2013  Family History:   Hypertension-- M   hyperlipidema--M   CAD--F , brother CABG age 41   parkinson--F   breast ca--no   colon ca--no   Social History:   Remarried, lives at [place], [place] w/ husband , 3 children  (2 in [place], one in [place]) Former Smoker-1994   Alcohol use- very rarely         Medication List       This list is accurate as of: 05/27/13  7:15 PM.  Always use your most recent med list.               aspirin 81 MG tablet  Take 81 mg by mouth at bedtime.     clobetasol cream 0.05 %  Commonly known as:  TEMOVATE  Apply topically 2 (two) times daily.     clonazePAM 1 MG tablet  Commonly known as:  KLONOPIN  TAKE ONE TABLET BY MOUTH AS  NEEDED FOR ANXIETY     CRESTOR 40 MG tablet  Generic drug:  rosuvastatin  TAKE ONE TABLET BY MOUTH AT BEDTIME     cyanocobalamin 1000 MCG/ML injection  Commonly known as:  (VITAMIN B-12)  Inject 1 mL (1,000 mcg total) into the muscle every 30 (thirty) days.     famotidine 20 MG tablet  Commonly known as:  PEPCID  Take 20 mg by mouth as needed for heartburn or indigestion.     fluticasone 50 MCG/ACT nasal spray  Commonly known as:  FLONASE  1 spray in each nostril twice a day as needed. Use the "crossover" technique as  discussed     hydrALAZINE 25 MG tablet  Commonly known as:  APRESOLINE  TAKE 1 TABLET TWICE A DAY     hydrochlorothiazide 25 MG tablet  Commonly known as:  HYDRODIURIL  TAKE ONE-HALF TABLET BY MOUTH AT BEDTIME     losartan 100 MG tablet  Commonly known as:  COZAAR  TAKE ONE TABLET BY MOUTH ONCE DAILY           Objective:   Physical Exam BP 159/83  Pulse 90  Temp(Src) 98 F (36.7 C)  Wt 165 lb (74.844 kg)  SpO2 100%  General -- alert, well-developed, NAD.   HEENT-- Not pale.  Lungs -- normal respiratory effort, no intercostal retractions, no accessory muscle use, and normal breath sounds.  Heart-- normal rate, regular rhythm, no murmur.  Abdomen-- Not distended, good bowel sounds,soft, non-tender. No rebound or rigidity. No mass,organomegaly. Extremities-- no pretibial edema bilaterally  Psych-- Cognition and judgment appear intact. Cooperative with normal attention span and concentration. No anxious or depressed appearing.       Assessment & Plan:  Acute gastroenteritis Resolving, see instructions  Hypertension, BP slightly elevated today, recommend self monitor BPs. See instructions  Today , I spent more than 25 min with the patient, >50% of the time counseling, and   reviewing the chart and labs ordered by other providers

## 2013-06-21 ENCOUNTER — Other Ambulatory Visit: Payer: Self-pay | Admitting: Orthopedic Surgery

## 2013-07-03 DIAGNOSIS — IMO0002 Reserved for concepts with insufficient information to code with codable children: Secondary | ICD-10-CM

## 2013-07-03 DIAGNOSIS — G5601 Carpal tunnel syndrome, right upper limb: Secondary | ICD-10-CM

## 2013-07-03 HISTORY — DX: Carpal tunnel syndrome, right upper limb: G56.01

## 2013-07-03 HISTORY — DX: Reserved for concepts with insufficient information to code with codable children: IMO0002

## 2013-07-16 ENCOUNTER — Telehealth: Payer: Self-pay | Admitting: Internal Medicine

## 2013-07-16 MED ORDER — HYDROCHLOROTHIAZIDE 25 MG PO TABS: ORAL_TABLET | ORAL | Status: DC

## 2013-07-16 NOTE — Telephone Encounter (Signed)
Rx sent      KP 

## 2013-07-16 NOTE — Telephone Encounter (Signed)
Caller name:Marbeth Lalani Relation to VO:[id number] Call back number:[phone] Pharmacy: [id number] - [place], [place] - [address]   Reason for call: to request a refill for hydrochlorothiazide (HYDRODIURIL) 25 MG tablet

## 2013-07-20 ENCOUNTER — Encounter (HOSPITAL_BASED_OUTPATIENT_CLINIC_OR_DEPARTMENT_OTHER): Payer: Self-pay | Admitting: *Deleted

## 2013-07-20 NOTE — Pre-Procedure Instructions (Signed)
To come for BMET 

## 2013-07-24 ENCOUNTER — Ambulatory Visit (HOSPITAL_BASED_OUTPATIENT_CLINIC_OR_DEPARTMENT_OTHER)
Admission: RE | Admit: 2013-07-24 | Discharge: 2013-07-24 | Disposition: A | Discharge status: Home / Self Care | Payer: Medicare Other | Source: Ambulatory Visit | Attending: Orthopedic Surgery | Admitting: Orthopedic Surgery

## 2013-07-24 ENCOUNTER — Ambulatory Visit (HOSPITAL_BASED_OUTPATIENT_CLINIC_OR_DEPARTMENT_OTHER): Payer: Medicare Other | Admitting: Anesthesiology

## 2013-07-24 ENCOUNTER — Encounter (HOSPITAL_BASED_OUTPATIENT_CLINIC_OR_DEPARTMENT_OTHER)
Admission: RE | Disposition: A | Discharge status: Home / Self Care | Payer: Self-pay | Source: Ambulatory Visit | Attending: Orthopedic Surgery

## 2013-07-24 ENCOUNTER — Encounter (HOSPITAL_BASED_OUTPATIENT_CLINIC_OR_DEPARTMENT_OTHER): Payer: Self-pay | Admitting: *Deleted

## 2013-07-24 ENCOUNTER — Encounter (HOSPITAL_BASED_OUTPATIENT_CLINIC_OR_DEPARTMENT_OTHER): Payer: Medicare Other | Admitting: Anesthesiology

## 2013-07-24 DIAGNOSIS — M19049 Primary osteoarthritis, unspecified hand: Secondary | ICD-10-CM | POA: Insufficient documentation

## 2013-07-24 DIAGNOSIS — M19039 Primary osteoarthritis, unspecified wrist: Secondary | ICD-10-CM | POA: Insufficient documentation

## 2013-07-24 DIAGNOSIS — I10 Essential (primary) hypertension: Secondary | ICD-10-CM | POA: Insufficient documentation

## 2013-07-24 DIAGNOSIS — G5600 Carpal tunnel syndrome, unspecified upper limb: Secondary | ICD-10-CM | POA: Insufficient documentation

## 2013-07-24 DIAGNOSIS — Z7982 Long term (current) use of aspirin: Secondary | ICD-10-CM | POA: Insufficient documentation

## 2013-07-24 DIAGNOSIS — Z87891 Personal history of nicotine dependence: Secondary | ICD-10-CM | POA: Insufficient documentation

## 2013-07-24 DIAGNOSIS — E785 Hyperlipidemia, unspecified: Secondary | ICD-10-CM | POA: Insufficient documentation

## 2013-07-24 DIAGNOSIS — Z8673 Personal history of transient ischemic attack (TIA), and cerebral infarction without residual deficits: Secondary | ICD-10-CM | POA: Insufficient documentation

## 2013-07-24 DIAGNOSIS — Z79899 Other long term (current) drug therapy: Secondary | ICD-10-CM | POA: Insufficient documentation

## 2013-07-24 DIAGNOSIS — M259 Joint disorder, unspecified: Secondary | ICD-10-CM | POA: Insufficient documentation

## 2013-07-24 HISTORY — DX: Presence of dental prosthetic device (complete) (partial): Z97.2

## 2013-07-24 HISTORY — DX: Other seasonal allergic rhinitis: J30.2

## 2013-07-24 HISTORY — DX: Carpal tunnel syndrome, right upper limb: G56.01

## 2013-07-24 HISTORY — DX: Unspecified osteoarthritis, unspecified site: M19.90

## 2013-07-24 HISTORY — PX: CARPAL TUNNEL RELEASE: SHX101

## 2013-07-24 HISTORY — DX: Family history of other specified conditions: Z84.89

## 2013-07-24 HISTORY — DX: Personal history of transient ischemic attack (TIA), and cerebral infarction without residual deficits: Z86.73

## 2013-07-24 HISTORY — DX: Personal history of (healed) traumatic fracture: Z87.81

## 2013-07-24 HISTORY — PX: MASS EXCISION: SHX2000

## 2013-07-24 HISTORY — DX: Localized swelling, mass and lump, unspecified: R22.9

## 2013-07-24 HISTORY — PX: FINGER ARTHROPLASTY: SHX5017

## 2013-07-24 LAB — POCT I-STAT, CHEM 8
BUN: 27 mg/dL — ABNORMAL HIGH (ref 6–23)
Calcium, Ion: 1.33 mmol/L — ABNORMAL HIGH (ref 1.13–1.30)
Chloride: 101 mEq/L (ref 96–112)
Creatinine, Ser: 1.2 mg/dL — ABNORMAL HIGH (ref 0.50–1.10)
GLUCOSE: 88 mg/dL (ref 70–99)
HEMATOCRIT: 54 % — AB (ref 36.0–46.0)
HEMOGLOBIN: 18.4 g/dL — AB (ref 12.0–15.0)
Potassium: 4.1 mEq/L (ref 3.7–5.3)
Sodium: 147 mEq/L (ref 137–147)
TCO2: 24 mmol/L (ref 0–100)

## 2013-07-24 SURGERY — EXCISION MASS
Anesthesia: Regional | Site: Hand | Laterality: Right

## 2013-07-24 MED ORDER — MIDAZOLAM HCL 2 MG/ML PO SYRP: 12.0000 mg | ORAL_SOLUTION | Freq: Once | ORAL | Status: DC | PRN

## 2013-07-24 MED ORDER — BUPIVACAINE HCL (PF) 0.25 % IJ SOLN
INTRAMUSCULAR | Status: AC
  Filled 2013-07-24: qty 30

## 2013-07-24 MED ORDER — BUPIVACAINE HCL (PF) 0.5 % IJ SOLN
INTRAMUSCULAR | Status: AC
  Filled 2013-07-24: qty 30

## 2013-07-24 MED ORDER — FENTANYL CITRATE 0.05 MG/ML IJ SOLN: 25.0000 ug | INTRAMUSCULAR | Status: DC | PRN

## 2013-07-24 MED ORDER — PHENYLEPHRINE HCL 10 MG/ML IJ SOLN
INTRAMUSCULAR | Status: DC | PRN
  Administered 2013-07-24 (×2): 40 ug via INTRAVENOUS

## 2013-07-24 MED ORDER — PROPOFOL 10 MG/ML IV EMUL
INTRAVENOUS | Status: AC
  Filled 2013-07-24: qty 50

## 2013-07-24 MED ORDER — SULFAMETHOXAZOLE-TMP DS 800-160 MG PO TABS: 1.0000 | ORAL_TABLET | Freq: Two times a day (BID) | ORAL | Status: DC

## 2013-07-24 MED ORDER — SUCCINYLCHOLINE CHLORIDE 20 MG/ML IJ SOLN
INTRAMUSCULAR | Status: AC
  Filled 2013-07-24: qty 1

## 2013-07-24 MED ORDER — MIDAZOLAM HCL 2 MG/2ML IJ SOLN
1.0000 mg | INTRAMUSCULAR | Status: DC | PRN
  Administered 2013-07-24: 2 mg via INTRAVENOUS

## 2013-07-24 MED ORDER — PROPOFOL 10 MG/ML IV BOLUS
INTRAVENOUS | Status: DC | PRN
  Administered 2013-07-24: 100 mg via INTRAVENOUS

## 2013-07-24 MED ORDER — CHLORHEXIDINE GLUCONATE 4 % EX LIQD: 60.0000 mL | Freq: Once | CUTANEOUS | Status: DC

## 2013-07-24 MED ORDER — FENTANYL CITRATE 0.05 MG/ML IJ SOLN
50.0000 ug | INTRAMUSCULAR | Status: DC | PRN
  Administered 2013-07-24: 100 ug via INTRAVENOUS

## 2013-07-24 MED ORDER — FENTANYL CITRATE 0.05 MG/ML IJ SOLN
INTRAMUSCULAR | Status: AC
  Filled 2013-07-24: qty 8

## 2013-07-24 MED ORDER — PHENYLEPHRINE HCL 10 MG/ML IJ SOLN
10.0000 mg | INTRAVENOUS | Status: DC | PRN
  Administered 2013-07-24: 50 ug/min via INTRAVENOUS

## 2013-07-24 MED ORDER — SODIUM BICARBONATE 4 % IV SOLN
INTRAVENOUS | Status: AC
  Filled 2013-07-24: qty 5

## 2013-07-24 MED ORDER — MIDAZOLAM HCL 2 MG/2ML IJ SOLN
INTRAMUSCULAR | Status: AC
  Filled 2013-07-24: qty 2

## 2013-07-24 MED ORDER — DEXAMETHASONE SODIUM PHOSPHATE 10 MG/ML IJ SOLN
INTRAMUSCULAR | Status: DC | PRN
  Administered 2013-07-24: 4 mg via INTRAVENOUS

## 2013-07-24 MED ORDER — TAPENTADOL HCL 50 MG PO TABS: 50.0000 mg | ORAL_TABLET | ORAL | Status: DC | PRN

## 2013-07-24 MED ORDER — LACTATED RINGERS IV SOLN: INTRAVENOUS | Status: DC

## 2013-07-24 MED ORDER — VANCOMYCIN HCL IN DEXTROSE 1-5 GM/200ML-% IV SOLN
INTRAVENOUS | Status: AC
Start: 2013-07-24 — End: 2013-07-24
  Filled 2013-07-24: qty 200

## 2013-07-24 MED ORDER — LACTATED RINGERS IV SOLN
INTRAVENOUS | Status: DC
  Administered 2013-07-24: 07:00:00 via INTRAVENOUS

## 2013-07-24 MED ORDER — FENTANYL CITRATE 0.05 MG/ML IJ SOLN
INTRAMUSCULAR | Status: AC
  Filled 2013-07-24: qty 2

## 2013-07-24 MED ORDER — ONDANSETRON HCL 4 MG/2ML IJ SOLN: 4.0000 mg | Freq: Once | INTRAMUSCULAR | Status: DC | PRN

## 2013-07-24 MED ORDER — ONDANSETRON HCL 4 MG/2ML IJ SOLN
INTRAMUSCULAR | Status: DC | PRN
  Administered 2013-07-24: 4 mg via INTRAVENOUS

## 2013-07-24 MED ORDER — VANCOMYCIN HCL IN DEXTROSE 1-5 GM/200ML-% IV SOLN
1000.0000 mg | INTRAVENOUS | Status: AC
  Administered 2013-07-24: 1000 mg via INTRAVENOUS

## 2013-07-24 MED ORDER — LIDOCAINE HCL (PF) 1 % IJ SOLN
INTRAMUSCULAR | Status: AC
  Filled 2013-07-24: qty 30

## 2013-07-24 SURGICAL SUPPLY — 83 items
BANDAGE COBAN STERILE 2 (GAUZE/BANDAGES/DRESSINGS) IMPLANT
BANDAGE ELASTIC 3 VELCRO ST LF (GAUZE/BANDAGES/DRESSINGS) ×4 IMPLANT
BLADE CARPAL TUNNEL SNGL USE (BLADE) ×2 IMPLANT
BLADE SURG 15 STRL LF DISP TIS (BLADE) ×3 IMPLANT
BLADE SURG 15 STRL SS (BLADE) ×8
BLADE SURG ROTATE 9660 (MISCELLANEOUS) ×2 IMPLANT
BNDG COHESIVE 1X5 TAN STRL LF (GAUZE/BANDAGES/DRESSINGS) IMPLANT
BNDG COHESIVE 3X5 TAN STRL LF (GAUZE/BANDAGES/DRESSINGS) IMPLANT
BNDG CONFORM 3 STRL LF (GAUZE/BANDAGES/DRESSINGS) ×2 IMPLANT
BNDG GAUZE ELAST 4 BULKY (GAUZE/BANDAGES/DRESSINGS) ×2 IMPLANT
BRUSH SCRUB EZ PLAIN DRY (MISCELLANEOUS) ×2 IMPLANT
BUR ROUND CARBIDE (BURR) IMPLANT
CANISTER SUCT 1200ML W/VALVE (MISCELLANEOUS) ×2 IMPLANT
CORDS BIPOLAR (ELECTRODE) ×2 IMPLANT
COVER MAYO STAND STRL (DRAPES) ×2 IMPLANT
COVER TABLE BACK 60X90 (DRAPES) ×2 IMPLANT
CUFF TOURNIQUET SINGLE 18IN (TOURNIQUET CUFF) IMPLANT
DECANTER SPIKE VIAL GLASS SM (MISCELLANEOUS) IMPLANT
DRAIN PENROSE 1/4X12 LTX STRL (WOUND CARE) IMPLANT
DRAPE EXTREMITY T 121X128X90 (DRAPE) ×2 IMPLANT
DRAPE OEC MINIVIEW 54X84 (DRAPES) ×1 IMPLANT
DRAPE SURG 17X23 STRL (DRAPES) ×2 IMPLANT
DRSG EMULSION OIL 3X3 NADH (GAUZE/BANDAGES/DRESSINGS) ×2 IMPLANT
GAUZE SPONGE 4X4 12PLY STRL (GAUZE/BANDAGES/DRESSINGS) IMPLANT
GAUZE SPONGE 4X4 16PLY XRAY LF (GAUZE/BANDAGES/DRESSINGS) IMPLANT
GAUZE XEROFORM 1X8 LF (GAUZE/BANDAGES/DRESSINGS) ×2 IMPLANT
GLOVE BIO SURGEON STRL SZ 6.5 (GLOVE) ×1 IMPLANT
GLOVE BIOGEL M STRL SZ7.5 (GLOVE) ×1 IMPLANT
GLOVE BIOGEL PI IND STRL 6.5 (GLOVE) IMPLANT
GLOVE BIOGEL PI IND STRL 7.0 (GLOVE) IMPLANT
GLOVE BIOGEL PI INDICATOR 6.5 (GLOVE) ×1
GLOVE BIOGEL PI INDICATOR 7.0 (GLOVE) ×1
GLOVE SS BIOGEL STRL SZ 8 (GLOVE) ×1 IMPLANT
GLOVE SUPERSENSE BIOGEL SZ 8 (GLOVE) ×1
GOWN STRL REUS W/ TWL LRG LVL3 (GOWN DISPOSABLE) ×1 IMPLANT
GOWN STRL REUS W/ TWL XL LVL3 (GOWN DISPOSABLE) ×1 IMPLANT
GOWN STRL REUS W/TWL LRG LVL3 (GOWN DISPOSABLE) ×2
GOWN STRL REUS W/TWL XL LVL3 (GOWN DISPOSABLE) ×2
IMPLANT FINGER 40MCP (Trauma) ×2 IMPLANT
LOOP VESSEL MAXI BLUE (MISCELLANEOUS) IMPLANT
NDL HYPO 25X1 1.5 SAFETY (NEEDLE) ×2 IMPLANT
NDL SAFETY ECLIPSE 18X1.5 (NEEDLE) ×1 IMPLANT
NEEDLE HYPO 18GX1.5 SHARP (NEEDLE) ×2
NEEDLE HYPO 22GX1.5 SAFETY (NEEDLE) IMPLANT
NEEDLE HYPO 25X1 1.5 SAFETY (NEEDLE) IMPLANT
NS IRRIG 1000ML POUR BTL (IV SOLUTION) ×2 IMPLANT
PACK BASIN DAY SURGERY FS (CUSTOM PROCEDURE TRAY) ×2 IMPLANT
PAD ALCOHOL SWAB (MISCELLANEOUS) ×16 IMPLANT
PAD CAST 3X4 CTTN HI CHSV (CAST SUPPLIES) ×2 IMPLANT
PADDING CAST ABS 3INX4YD NS (CAST SUPPLIES) ×1
PADDING CAST ABS 4INX4YD NS (CAST SUPPLIES) ×1
PADDING CAST ABS COTTON 3X4 (CAST SUPPLIES) ×1 IMPLANT
PADDING CAST ABS COTTON 4X4 ST (CAST SUPPLIES) ×1 IMPLANT
PADDING CAST COTTON 3X4 STRL (CAST SUPPLIES) ×4
SHEET MEDIUM DRAPE 40X70 STRL (DRAPES) IMPLANT
SPLINT FIBERGLASS 3X35 (CAST SUPPLIES) IMPLANT
SPLINT FIBERGLASS 4X30 (CAST SUPPLIES) ×1 IMPLANT
SPLINT PLASTER CAST XFAST 3X15 (CAST SUPPLIES) IMPLANT
SPLINT PLASTER CAST XFAST 4X15 (CAST SUPPLIES) IMPLANT
SPLINT PLASTER XTRA FAST SET 4 (CAST SUPPLIES)
SPLINT PLASTER XTRA FASTSET 3X (CAST SUPPLIES)
STOCKINETTE 4X48 STRL (DRAPES) ×2 IMPLANT
STOCKINETTE SYNTHETIC 3 UNSTER (CAST SUPPLIES) ×2 IMPLANT
STOCKINETTE SYNTHETIC 4 NONSTR (MISCELLANEOUS) IMPLANT
STRIP CLOSURE SKIN 1/2X4 (GAUZE/BANDAGES/DRESSINGS) IMPLANT
SUCTION FRAZIER TIP 10 FR DISP (SUCTIONS) ×2 IMPLANT
SUT FIBERWIRE 3-0 18 TAPR NDL (SUTURE)
SUT FIBERWIRE 4-0 18 TAPR NDL (SUTURE) ×8
SUT PROLENE 4 0 PS 2 18 (SUTURE) ×6 IMPLANT
SUT PROLENE 5 0 P 3 (SUTURE) IMPLANT
SUT VICRYL 4-0 PS2 18IN ABS (SUTURE) ×1 IMPLANT
SUT VICRYL RAPIDE 4/0 PS 2 (SUTURE) ×2 IMPLANT
SUTURE FIBERWR 3-0 18 TAPR NDL (SUTURE) IMPLANT
SUTURE FIBERWR 4-0 18 TAPR NDL (SUTURE) ×2 IMPLANT
SYR BULB 3OZ (MISCELLANEOUS) ×2 IMPLANT
SYR CONTROL 10ML LL (SYRINGE) ×2 IMPLANT
TAPE SURG TRANSPORE 1 IN (GAUZE/BANDAGES/DRESSINGS) ×1 IMPLANT
TAPE SURGICAL TRANSPORE 1 IN (GAUZE/BANDAGES/DRESSINGS) ×1
TOWEL OR 17X24 6PK STRL BLUE (TOWEL DISPOSABLE) ×4 IMPLANT
TOWEL OR NON WOVEN STRL DISP B (DISPOSABLE) ×2 IMPLANT
TRAY DSU PREP LF (CUSTOM PROCEDURE TRAY) ×2 IMPLANT
TUBE CONNECTING 20X1/4 (TUBING) ×2 IMPLANT
UNDERPAD 30X30 INCONTINENT (UNDERPADS AND DIAPERS) ×2 IMPLANT

## 2013-07-24 NOTE — Anesthesia Postprocedure Evaluation (Signed)
  Anesthesia Post-op Note  Patient: Tammy [name]  Procedure(s) Performed: Procedure(s): RIGHT VOLAR WRIST MASS EXCISION AND JOINT DEBRIDEMENT (Right) RIGHT INDEX FINGER AND MIDDLE FINGER MCP ARTHROPLASTY AND LIGAMENT RECONSTRUCTION AND REPAIR AS NECESSARY (Right) RIGHT CARPAL TUNNEL RELEASE (Right)  Patient Location: PACU  Anesthesia Type:General  Level of Consciousness: awake, alert  and oriented  Airway and Oxygen Therapy: Patient Spontanous Breathing  Post-op Pain: none  Post-op Assessment: Post-op vitals reviewed, stable  Post-op Vital Signs: stable  Last Vitals:  Filed Vitals:   07/24/13 1234  BP: 124/63  Pulse: 60  Temp: 36.4 C  Resp: 16    Complications: No apparent anesthesia complications

## 2013-07-24 NOTE — Progress Notes (Signed)
Assisted Dr. Joslin with right, ultrasound guided, supraclavicular block. Side rails up, monitors on throughout procedure. See vital signs in flow sheet. Tolerated Procedure well. 

## 2013-07-24 NOTE — Discharge Instructions (Signed)
Please call for any problems  Our office will call for your followup  You will see Dr. Amedeo Plenty and therapy in 12-14 days   Please keep your arm elevated and you may move the fingers within the confines of your bandage   If you have problems as we can Dr. Amedeo Plenty cell phone number she is [phone]  Keep bandage clean and dry.  Call for any problems.  No smoking.  Criteria for driving a car: you should be off your pain medicine for 7-8 hours, able to drive one handed(confident), thinking clearly and feeling able in your judgement to drive. Continue elevation as it will decrease swelling.  If instructed by MD move your fingers within the confines of the bandage/splint.  Use ice if instructed by your MD. Call immediately for any sudden loss of feeling in your hand/arm or change in functional abilities of the extremity.   We recommend that you to take vitamin C 1000 mg a day to promote healing we also recommend that if you require her pain medicine that he take a stool softener to prevent constipation as most pain medicines will have constipation side effects. We recommend either Peri-Colace or Senokot and recommend that you also consider adding MiraLAX to prevent the constipation affects from pain medicine if you are required to use them. These medicines are over the counter and maybe purchased at a local pharmacy.  Post Anesthesia Home Care Instructions  Activity: Get plenty of rest for the remainder of the day. A responsible adult should stay with you for 24 hours following the procedure.  For the next 24 hours, DO NOT: -Drive a car -Paediatric nurse -Drink alcoholic beverages -Take any medication unless instructed by your physician -Make any legal decisions or sign important papers.  Meals: Start with liquid foods such as gelatin or soup. Progress to regular foods as tolerated. Avoid greasy, spicy, heavy foods. If nausea and/or vomiting occur, drink only clear liquids until the nausea  and/or vomiting subsides. Call your physician if vomiting continues.  Special Instructions/Symptoms: Your throat may feel dry or sore from the anesthesia or the breathing tube placed in your throat during surgery. If this causes discomfort, gargle with warm salt water. The discomfort should disappear within 24 hours.   Regional Anesthesia Blocks  1. Numbness or the inability to move the "blocked" extremity may last from 3-48 hours after placement. The length of time depends on the medication injected and your individual response to the medication. If the numbness is not going away after 48 hours, call your surgeon.  2. The extremity that is blocked will need to be protected until the numbness is gone and the  Strength has returned. Because you cannot feel it, you will need to take extra care to avoid injury. Because it may be weak, you may have difficulty moving it or using it. You may not know what position it is in without looking at it while the block is in effect.  3. For blocks in the legs and feet, returning to weight bearing and walking needs to be done carefully. You will need to wait until the numbness is entirely gone and the strength has returned. You should be able to move your leg and foot normally before you try and bear weight or walk. You will need someone to be with you when you first try to ensure you do not fall and possibly risk injury.  4. Bruising and tenderness at the needle site are common side effects and  will resolve in a few days.  5. Persistent numbness or new problems with movement should be communicated to the surgeon or the [facility] [phone] [place] [phone]).

## 2013-07-24 NOTE — Anesthesia Procedure Notes (Addendum)
Anesthesia Regional Block:  Supraclavicular block  Pre-Anesthetic Checklist: ,, timeout performed, Correct Patient, Correct Site, Correct Laterality, Correct Procedure, Correct Position, site marked, Risks and benefits discussed,  Surgical consent,  Pre-op evaluation,  At surgeon's request and post-op pain management  Laterality: Right  Prep: chloraprep       Needles:  Injection technique: Single-shot  Needle Type: Echogenic Stimulator Needle     Needle Length: 9cm 9 cm Needle Gauge: 22 and 22 G    Additional Needles:  Procedures: ultrasound guided (picture in chart) Supraclavicular block Narrative:  Start time: 07/24/2013 8:42 AM End time: 07/24/2013 8:47 AM Injection made incrementally with aspirations every 5 mL.  Additional Notes: 30 cc 0.5% Marcaine with 1:200 Epi injected easily   Procedure Name: LMA Insertion Date/Time: 07/24/2013 8:54 AM Performed by: Baxter Flattery Pre-anesthesia Checklist: Patient identified, Emergency Drugs available, Suction available and Patient being monitored Patient Re-evaluated:Patient Re-evaluated prior to inductionOxygen Delivery Method: Circle System Utilized Preoxygenation: Pre-oxygenation with 100% oxygen Intubation Type: IV induction Ventilation: Mask ventilation without difficulty LMA: LMA inserted LMA Size: 4.0 Number of attempts: 1 Airway Equipment and Method: bite block Placement Confirmation: positive ETCO2 and breath sounds checked- equal and bilateral Tube secured with: Tape Dental Injury: Teeth and Oropharynx as per pre-operative assessment
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
## 2013-07-24 NOTE — Op Note (Signed)
See dictation #[id number] [place] Md

## 2013-07-24 NOTE — Anesthesia Preprocedure Evaluation (Addendum)
Anesthesia Evaluation  Patient identified by MRN, date of birth, ID band Patient awake    Reviewed: Allergy & Precautions, H&P , NPO status , Patient's Chart, lab work & pertinent test results  Airway Mallampati: II TM Distance: >3 FB Neck ROM: Full    Dental  (+) Teeth Intact, Dental Advisory Given   Pulmonary former smoker,  breath sounds clear to auscultation        Cardiovascular hypertension, Rhythm:Regular Rate:Normal     Neuro/Psych    GI/Hepatic   Endo/Other    Renal/GU      Musculoskeletal   Abdominal   Peds  Hematology   Anesthesia Other Findings   Reproductive/Obstetrics                           Anesthesia Physical Anesthesia Plan  ASA: III  Anesthesia Plan: General   Post-op Pain Management:    Induction: Intravenous  Airway Management Planned: LMA  Additional Equipment:   Intra-op Plan:   Post-operative Plan:   Informed Consent: I have reviewed the patients History and Physical, chart, labs and discussed the procedure including the risks, benefits and alternatives for the proposed anesthesia with the patient or authorized representative who has indicated his/her understanding and acceptance.   Dental advisory given  Plan Discussed with: CRNA and Anesthesiologist  Anesthesia Plan Comments: (Mass R.hand, CTS, and arthritis index and ring finger MCP joints H/O TIA, S/P R. CEA 2000 Htn (-) Cardiac cath 2013 S/P cervical fusion  Plan GA with LMA and supraclavicular block  Roberts Gaudy)       Anesthesia Quick Evaluation

## 2013-07-24 NOTE — Transfer of Care (Signed)
Immediate Anesthesia Transfer of Care Note  Patient: Tammy [name]  Procedure(s) Performed: Procedure(s): RIGHT VOLAR WRIST MASS EXCISION AND JOINT DEBRIDEMENT (Right) RIGHT INDEX FINGER AND MIDDLE FINGER MCP ARTHROPLASTY AND LIGAMENT RECONSTRUCTION AND REPAIR AS NECESSARY (Right) RIGHT CARPAL TUNNEL RELEASE (Right)  Patient Location: PACU  Anesthesia Type:GA combined with regional for post-op pain  Level of Consciousness: awake, alert  and oriented  Airway & Oxygen Therapy: Patient Spontanous Breathing and Patient connected to face mask oxygen  Post-op Assessment: Report given to PACU RN, Post -op Vital signs reviewed and stable and Patient moving all extremities  Post vital signs: Reviewed and stable  Complications: No apparent anesthesia complications

## 2013-07-28 ENCOUNTER — Encounter (HOSPITAL_BASED_OUTPATIENT_CLINIC_OR_DEPARTMENT_OTHER): Payer: Self-pay | Admitting: Orthopedic Surgery

## 2013-07-28 NOTE — Op Note (Signed)
NAMEROYCE, SCIARA NO.:  [id number]  MEDICAL RECORD NO.:  [id number]  LOCATION:                               FACILITY:  [facility]  PHYSICIAN:  Satira Anis. Janissa Bertram, M.D.DATE OF BIRTH:  October 03, 1934  DATE OF PROCEDURE:  07/24/2013 DATE OF DISCHARGE:  07/24/2013                              OPERATIVE REPORT   PREOPERATIVE DIAGNOSES: 1. 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 2. Volar radial wrist mass. 3. Right carpal tunnel syndrome.  POSTOPERATIVE DIAGNOSES: 1. 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 2. Volar radial wrist mass. 3. Right carpal tunnel syndrome.  PROCEDURE: 1. Right metacarpophalangeal joint replacement with a Neuflex size 40     implant. 2. Metacarpophalangeal arthroplasty middle finger with a Neuflex size     40 implant. 3. Extensor realignment with radial collateral ligament     reconstruction, right index finger. 4. Extensor realignment with synovectomy and capsular repair, right     middle finger. 5. AP, lateral, and multiple oblique radiographs right hand performed     and interpreted by myself. 6. Volar radial wrist mass excision 1.5 cm or greater in nature deep     in location. 7. Arthrotomy, synovectomy, radiocarpal joint, right wrist. 8. Open right carpal tunnel release.  SURGEON:  Satira Anis. Amedeo Plenty, M.D.  ASSISTANTS:  None.  COMPLICATION:  None.  ANESTHESIA:  General with preoperative block.  TOURNIQUET TIME:  Less than 90 minutes.  INDICATIONS:  A 79 year old female with the above-mentioned diagnosis. I have counseled in regard to risks, benefits, surgery and she desires to proceed with the above-mentioned operative intervention.  All questions have been encouraged and answered preoperatively.  OPERATION IN DETAIL:  The patient was seen by myself and Anesthesia, taken to operative suite, and underwent smooth induction of  general anesthetic.  Preoperatively, a block was placed.  She looked very well preoperatively and was extensively counseled.  In the operative arena, we scrubbed the arm with Betadine scrub and paint.  We called a time- out, pre and postop check were secured.  Following this, we performed an incision after a time-out, about the transverse carpal ligament region. A 1-inch incision was made.  Dissection was carried out to the palmar fascia which was incised.  Following this, transverse carpal ligament was identified and released under 4.0 loupe magnification.  Once this was complete, distal or proximal dissection was carried down and was variable to slide the blunt tip scissors, the remaining leaflet. Portions of the antebrachial fascia were also released.  The patient tolerated this well.  There were no complicating features.  The area was irrigated and closed at the conclusion of the case with hemostasis being secured.  The median nerve was completely released. There were no iatrogenic injury, complications or space-occupying lesions in the canal noted.  Once this was complete, we then performed a volar radial incision. Dissection was carried down through the volar radial incision 1 inch. The radial artery was identified, swept out of harm's way.  The 1.5 cm or greater mass was then circumferentially dissected and removed at its stock origin at the radiocarpal  joint.  Following this, the patient had arthrotomy and synovectomy of the radiocarpal joint performed.  This was done without difficulty and there were no complicating features.  Once this was performed and irrigated, we then closed the wound at the conclusion of the case with tourniquet deflated and hemostasis secured.  Thus, a greater than 1.5 cm deep mass was excised and arthrotomy synovectomy of the wrist joint/radiocarpal joint was performed.  Once this was completed, the hand was turned over.  Incision was made over the  index finger.  Dissection was carried down interval between the EIP and EDC was created.  Capsular flaps were then created and the joint was then opened.  Osteophytes were removed with rongeur.  Following this, the patient then underwent a oscillating saw cut, synovectomy, and the joint was then prepared for the arthroplasty.  We sized to a 40 Neuflex MCP and checked the trials.  Once this done, I left the trial in and then preset stitches of a 4-0 FiberWire in a modified Krackow fashion about the radial collateral ligament.  This was radial collateral ligament reconstruction.  The radial collateral ligament was completely devoid of meaningful attachment at the metacarpal head.  It was preset with little drill hole through the radial aspect.  This of course was done to prevent extreme forces with key pinch and other forceful loads of the index finger given the fact that it is a border digit.  Following this, we irrigated copiously and left the size 40 trial in and turned attention towards the middle finger.  The middle finger was open in a similar fashion, split between the [facility] was created. Capsule was incised, elevated, osteophytes removed, joint shotgunned open. Oscillating saw placed about the metacarpal head and the proximal phalanx was then prepared similarly with a fine shaving of the oscillating saw.  Following this, we then broached to a size 40 Neuflex and trialed this as well.  It looked very good.  X-rays were taken before and after the trials.  Following this, we irrigated copiously and then implanted the size 40 Neuflex MCP silicone arthroplasty about the index and middle finger with no-touch technique.  The patient tolerated this well.  Following this, the radial collateral ligament was tied down and sutured with the previously placed FiberWire stitch.  All looked quite well.  X-rays were taken.  AP, lateral and oblique and I reviewed, performed, and evaluated the  x-rays myself and deemed them to be excellent.  Following this, we irrigated copiously followed by closure of the capsule with FiberWire followed by closure of the interval about the extensor with FiberWire and a mild extensor realignment to lessen any ulnar deviatory forces.  The tourniquet was deflated.  Hemostasis secured.  All wounds were then closed with Prolene.  Final x-rays were taken and reviewed by myself.  I was pleased with this and the findings.  The patient tolerated this well.  There were no complicating features.  Given the tremendous arthrosis present, I feel the patient will dramatically benefit from the joint replacement.  We will see her back in the office in 12-14 days given the therapy.  These notes discussed and all questions have been encouraged and answered.     Satira Anis. Amedeo Plenty, M.D.     [facility]  D:  07/24/2013  T:  07/24/2013  Job:  [id number]

## 2013-08-13 ENCOUNTER — Other Ambulatory Visit: Payer: Self-pay | Admitting: Internal Medicine

## 2013-08-14 ENCOUNTER — Telehealth: Payer: Self-pay | Admitting: Internal Medicine

## 2013-08-14 MED ORDER — LOSARTAN POTASSIUM 100 MG PO TABS: ORAL_TABLET | ORAL | Status: DC

## 2013-08-14 MED ORDER — ROSUVASTATIN CALCIUM 40 MG PO TABS: ORAL_TABLET | ORAL | Status: DC

## 2013-08-14 NOTE — Telephone Encounter (Signed)
Caller name: Jordy  Call back number:[phone] Pharmacy: [facility] in [place], [place]  Reason for call:  Pt is needing refill on RX CRESTOR 40 MG tablet and losartan (COZAAR) 100 MG tablet

## 2013-08-14 NOTE — Telephone Encounter (Signed)
Done . rx sent  

## 2013-09-11 ENCOUNTER — Other Ambulatory Visit: Payer: Self-pay | Admitting: Internal Medicine

## 2013-09-18 ENCOUNTER — Ambulatory Visit: Payer: Medicare Other | Admitting: Internal Medicine

## 2013-10-10 ENCOUNTER — Other Ambulatory Visit: Payer: Self-pay | Admitting: Internal Medicine

## 2013-10-14 ENCOUNTER — Ambulatory Visit (INDEPENDENT_AMBULATORY_CARE_PROVIDER_SITE_OTHER): Payer: Medicare Other | Admitting: Internal Medicine

## 2013-10-14 ENCOUNTER — Encounter: Payer: Self-pay | Admitting: Internal Medicine

## 2013-10-14 ENCOUNTER — Ambulatory Visit: Payer: Medicare Other | Admitting: Internal Medicine

## 2013-10-14 VITALS — BP 125/69 | HR 77 | Temp 97.8°F | Wt 163.0 lb

## 2013-10-14 DIAGNOSIS — I6529 Occlusion and stenosis of unspecified carotid artery: Secondary | ICD-10-CM

## 2013-10-14 DIAGNOSIS — I10 Essential (primary) hypertension: Secondary | ICD-10-CM

## 2013-10-14 NOTE — Assessment & Plan Note (Signed)
Currently well controlled, no edema, last BMP satisfactory. No change
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
## 2013-10-14 NOTE — Progress Notes (Signed)
Pre visit review using our clinic review tool, if applicable. No additional management support is needed unless otherwise documented below in the visit note. 

## 2013-10-14 NOTE — Patient Instructions (Signed)
Next visit is for a physical exam by 03-2014 , fasting Please make an appointment

## 2013-10-14 NOTE — Assessment & Plan Note (Signed)
Due for a carotid ultrasound, states that she plans to get established with a new heart doctor in [facility] and plans to ask him for the carotid ultrasound

## 2013-10-29 ENCOUNTER — Telehealth: Payer: Self-pay

## 2013-10-29 NOTE — Telephone Encounter (Signed)
Spoke with Pt about HCTZ being recalled, she has already been informed and does not currently need a refill on her medication.

## 2013-11-03 ENCOUNTER — Other Ambulatory Visit (HOSPITAL_COMMUNITY): Payer: Self-pay | Admitting: Cardiology

## 2013-11-03 ENCOUNTER — Ambulatory Visit (HOSPITAL_COMMUNITY): Payer: Medicare Other | Attending: Cardiovascular Disease | Admitting: Cardiology

## 2013-11-03 DIAGNOSIS — Z8673 Personal history of transient ischemic attack (TIA), and cerebral infarction without residual deficits: Secondary | ICD-10-CM | POA: Insufficient documentation

## 2013-11-03 DIAGNOSIS — E785 Hyperlipidemia, unspecified: Secondary | ICD-10-CM | POA: Diagnosis not present

## 2013-11-03 DIAGNOSIS — Z87891 Personal history of nicotine dependence: Secondary | ICD-10-CM | POA: Diagnosis not present

## 2013-11-03 DIAGNOSIS — I6529 Occlusion and stenosis of unspecified carotid artery: Secondary | ICD-10-CM | POA: Diagnosis not present

## 2013-11-03 DIAGNOSIS — I2510 Atherosclerotic heart disease of native coronary artery without angina pectoris: Secondary | ICD-10-CM | POA: Insufficient documentation

## 2013-11-03 DIAGNOSIS — I10 Essential (primary) hypertension: Secondary | ICD-10-CM | POA: Diagnosis not present

## 2013-11-03 NOTE — Progress Notes (Signed)
Carotid duplex performed 

## 2013-11-10 ENCOUNTER — Other Ambulatory Visit: Payer: Self-pay | Admitting: Family Medicine

## 2014-01-06 ENCOUNTER — Ambulatory Visit (INDEPENDENT_AMBULATORY_CARE_PROVIDER_SITE_OTHER): Payer: Medicare Other | Admitting: Cardiovascular Disease

## 2014-01-06 ENCOUNTER — Encounter: Payer: Self-pay | Admitting: Cardiovascular Disease

## 2014-01-06 VITALS — BP 130/62 | HR 68 | Ht 65.0 in | Wt 167.8 lb

## 2014-01-06 DIAGNOSIS — I10 Essential (primary) hypertension: Secondary | ICD-10-CM

## 2014-01-06 DIAGNOSIS — I739 Peripheral vascular disease, unspecified: Secondary | ICD-10-CM

## 2014-01-06 DIAGNOSIS — I779 Disorder of arteries and arterioles, unspecified: Secondary | ICD-10-CM

## 2014-01-06 DIAGNOSIS — I2510 Atherosclerotic heart disease of native coronary artery without angina pectoris: Secondary | ICD-10-CM
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
## 2014-01-06 NOTE — Patient Instructions (Signed)
Your physician wants you to follow-up in:  12 months.  You will receive a reminder letter in the mail two months in advance. If you don't receive a letter, please call our office to schedule the follow-up appointment.    Your physician has requested that you have a carotid duplex. This test is an ultrasound of the carotid arteries in your neck. It looks at blood flow through these arteries that supply the brain with blood. Allow one hour for this exam. There are no restrictions or special instructions. To be done in March 2016

## 2014-02-11 ENCOUNTER — Encounter (HOSPITAL_COMMUNITY): Payer: Self-pay | Admitting: Cardiovascular Disease

## 2014-03-23 ENCOUNTER — Telehealth: Payer: Self-pay | Admitting: Internal Medicine

## 2014-03-23 ENCOUNTER — Encounter: Payer: Medicare Other | Admitting: Internal Medicine

## 2014-03-23 MED ORDER — CYANOCOBALAMIN 1000 MCG/ML IJ SOLN: INTRAMUSCULAR | Status: AC

## 2014-03-23 NOTE — Telephone Encounter (Signed)
Pt requesting refill on her B-12 injections, she has rescheduled her CPE for 06/04/2014 at 3:30, okay to refill until then?

## 2014-03-23 NOTE — Telephone Encounter (Signed)
Caller name: Arelie Kuzel Relation to pt: Call back number: [phone] Pharmacy: [facility]  Reason for call: Pt is requesting cyanocobalamin 1000  Inject  1 ml  B-12. Please advise.

## 2014-03-23 NOTE — Telephone Encounter (Signed)
Yes please

## 2014-03-23 NOTE — Telephone Encounter (Signed)
Cyanocobalamin (B-12) sent to [facility] in [place] as requested.

## 2014-03-24 ENCOUNTER — Other Ambulatory Visit: Payer: Self-pay

## 2014-03-24 MED ORDER — ROSUVASTATIN CALCIUM 40 MG PO TABS: ORAL_TABLET | ORAL | Status: DC

## 2014-03-24 MED ORDER — LOSARTAN POTASSIUM 100 MG PO TABS: ORAL_TABLET | ORAL | Status: DC

## 2014-03-24 MED ORDER — HYDRALAZINE HCL 25 MG PO TABS: 25.0000 mg | ORAL_TABLET | Freq: Two times a day (BID) | ORAL | Status: DC

## 2014-05-04 ENCOUNTER — Encounter (HOSPITAL_COMMUNITY): Payer: Medicare Other

## 2014-05-07 ENCOUNTER — Ambulatory Visit (HOSPITAL_COMMUNITY): Payer: Medicare Other | Attending: Cardiovascular Disease | Admitting: *Deleted

## 2014-05-07 DIAGNOSIS — Z87891 Personal history of nicotine dependence: Secondary | ICD-10-CM | POA: Diagnosis not present

## 2014-05-07 DIAGNOSIS — I2510 Atherosclerotic heart disease of native coronary artery without angina pectoris: Secondary | ICD-10-CM | POA: Insufficient documentation

## 2014-05-07 DIAGNOSIS — I10 Essential (primary) hypertension: Secondary | ICD-10-CM | POA: Diagnosis not present

## 2014-05-07 DIAGNOSIS — E785 Hyperlipidemia, unspecified: Secondary | ICD-10-CM | POA: Insufficient documentation

## 2014-05-07 DIAGNOSIS — Z8673 Personal history of transient ischemic attack (TIA), and cerebral infarction without residual deficits: Secondary | ICD-10-CM | POA: Insufficient documentation

## 2014-05-07 DIAGNOSIS — I779 Disorder of arteries and arterioles, unspecified: Secondary | ICD-10-CM

## 2014-05-07 DIAGNOSIS — I739 Peripheral vascular disease, unspecified: Secondary | ICD-10-CM

## 2014-05-07 NOTE — Progress Notes (Signed)
Carotid Duplex Exam Performed 

## 2014-05-20 ENCOUNTER — Other Ambulatory Visit: Payer: Self-pay | Admitting: Internal Medicine

## 2014-06-03 ENCOUNTER — Other Ambulatory Visit: Payer: Self-pay

## 2014-06-03 DIAGNOSIS — K649 Unspecified hemorrhoids: Secondary | ICD-10-CM | POA: Insufficient documentation

## 2014-06-04 ENCOUNTER — Ambulatory Visit (INDEPENDENT_AMBULATORY_CARE_PROVIDER_SITE_OTHER): Payer: Medicare Other | Admitting: Internal Medicine

## 2014-06-04 ENCOUNTER — Ambulatory Visit (HOSPITAL_BASED_OUTPATIENT_CLINIC_OR_DEPARTMENT_OTHER)
Admission: RE | Admit: 2014-06-04 | Discharge: 2014-06-04 | Disposition: A | Discharge status: Home / Self Care | Payer: Medicare Other | Source: Ambulatory Visit | Attending: Internal Medicine | Admitting: Internal Medicine

## 2014-06-04 ENCOUNTER — Encounter: Payer: Self-pay | Admitting: Internal Medicine

## 2014-06-04 VITALS — BP 132/84 | HR 63 | Temp 98.2°F | Ht 65.0 in | Wt 167.5 lb

## 2014-06-04 DIAGNOSIS — E785 Hyperlipidemia, unspecified: Secondary | ICD-10-CM

## 2014-06-04 DIAGNOSIS — E538 Deficiency of other specified B group vitamins: Secondary | ICD-10-CM

## 2014-06-04 DIAGNOSIS — I10 Essential (primary) hypertension: Secondary | ICD-10-CM

## 2014-06-04 DIAGNOSIS — L409 Psoriasis, unspecified: Secondary | ICD-10-CM | POA: Diagnosis not present

## 2014-06-04 DIAGNOSIS — Z23 Encounter for immunization: Secondary | ICD-10-CM | POA: Diagnosis not present

## 2014-06-04 DIAGNOSIS — R0781 Pleurodynia: Secondary | ICD-10-CM | POA: Diagnosis not present

## 2014-06-04 DIAGNOSIS — D510 Vitamin B12 deficiency anemia due to intrinsic factor deficiency: Secondary | ICD-10-CM

## 2014-06-04 DIAGNOSIS — R0789 Other chest pain: Secondary | ICD-10-CM

## 2014-06-04 DIAGNOSIS — Z1239 Encounter for other screening for malignant neoplasm of breast: Secondary | ICD-10-CM

## 2014-06-04 DIAGNOSIS — M8580 Other specified disorders of bone density and structure, unspecified site: Secondary | ICD-10-CM

## 2014-06-04 DIAGNOSIS — R7989 Other specified abnormal findings of blood chemistry: Secondary | ICD-10-CM

## 2014-06-04 DIAGNOSIS — Z0000 Encounter for general adult medical examination without abnormal findings: Secondary | ICD-10-CM | POA: Diagnosis not present

## 2014-06-04 DIAGNOSIS — R946 Abnormal results of thyroid function studies: Secondary | ICD-10-CM

## 2014-06-04 DIAGNOSIS — J069 Acute upper respiratory infection, unspecified: Secondary | ICD-10-CM

## 2014-06-04 LAB — CBC WITH DIFFERENTIAL/PLATELET
BASOS ABS: 0.1 10*3/uL (ref 0.0–0.1)
Basophils Relative: 1 % (ref 0–1)
EOS ABS: 0.2 10*3/uL (ref 0.0–0.7)
EOS PCT: 3 % (ref 0–5)
HCT: 43.3 % (ref 36.0–46.0)
Hemoglobin: 13.7 g/dL (ref 12.0–15.0)
Lymphocytes Relative: 38 % (ref 12–46)
Lymphs Abs: 2.4 10*3/uL (ref 0.7–4.0)
MCH: 27.5 pg (ref 26.0–34.0)
MCHC: 31.6 g/dL (ref 30.0–36.0)
MCV: 86.8 fL (ref 78.0–100.0)
MPV: 11.7 fL (ref 8.6–12.4)
Monocytes Absolute: 0.5 10*3/uL (ref 0.1–1.0)
Monocytes Relative: 8 % (ref 3–12)
Neutro Abs: 3.1 10*3/uL (ref 1.7–7.7)
Neutrophils Relative %: 50 % (ref 43–77)
PLATELETS: 151 10*3/uL (ref 150–400)
RBC: 4.99 MIL/uL (ref 3.87–5.11)
RDW: 15.4 % (ref 11.5–15.5)
WBC: 6.2 10*3/uL (ref 4.0–10.5)

## 2014-06-04 MED ORDER — ROSUVASTATIN CALCIUM 40 MG PO TABS: 40.0000 mg | ORAL_TABLET | Freq: Every day | ORAL | Status: AC

## 2014-06-04 MED ORDER — HYDROCHLOROTHIAZIDE 25 MG PO TABS: 12.5000 mg | ORAL_TABLET | Freq: Every day | ORAL | Status: AC

## 2014-06-04 MED ORDER — CLONAZEPAM 1 MG PO TABS: ORAL_TABLET | ORAL | Status: AC

## 2014-06-04 MED ORDER — LOSARTAN POTASSIUM 100 MG PO TABS: 100.0000 mg | ORAL_TABLET | Freq: Every day | ORAL | Status: AC

## 2014-06-04 MED ORDER — HYDRALAZINE HCL 25 MG PO TABS: 25.0000 mg | ORAL_TABLET | Freq: Two times a day (BID) | ORAL | Status: AC

## 2014-06-04 MED ORDER — CLOBETASOL PROPIONATE 0.05 % EX CREA: TOPICAL_CREAM | Freq: Two times a day (BID) | CUTANEOUS | Status: AC

## 2014-06-04 MED ORDER — FLUTICASONE PROPIONATE 50 MCG/ACT NA SUSP: 1.0000 | Freq: Two times a day (BID) | NASAL | Status: AC | PRN

## 2014-06-04 NOTE — Assessment & Plan Note (Signed)
Initially diagnosis psoriasis, later on thought was eczema by a dermatologist, uses topical steroids  as needed. Refill when necessary

## 2014-06-04 NOTE — Progress Notes (Signed)
Subjective:    Patient ID: Tammy [name], female    DOB: 08/05/1934, 80 y.o.   MRN: [id number]  DOS:  06/04/2014 Type of visit - description :  Interval history:  Here for Medicare AWV: 1.Risk factors based on Past M, S, F history: reviewed  2. Physical Activities: less active d/t R foot pain, already saw the ortho doctor for it   3.Depression/mood: (-) screening  4.Hearing: mild B tinnitus x years, no problems hearing  5.ADL's: totally independent 6.Fall Risk: low risk, no h/o falls , see instructions  7. Home Safety: does feel safe at home 8. Height, weight, &visual acuity: see VS, s/p cataracts surgery 2014,  sees eye md q year vision is great  9. Counseling: yes, see below 10. Labs ordered based on risk factors: yes 11. Referral Coordination, if needed 12. Care Plan-- see a/p 13. Cognitive Assessment, motor skills, memory and balance seems stable and better than average for age   109. Care team updated 15. End-of-life care discussed  in addition, we discussed the following issues: CAD, recently saw cardiology, felt to be stable High cholesterol, on statins, due for labs, no apparent side effects Hypertension, good medication compliance B12 deficiency, on monthly shots Also for one year has area at the right lateral chest that hurts, mostly with certain positions or when she touches the area.   Past Medical History:  Hyperlipidemia  Hypertension  CV:  --Coronary artery disease, stress test 2004 "fix defect"  --CP-dizzines 3-13----> myoview showed a defect, cath negative  --Cerebrovascular accident 2000, CAROTID ENDARTERECTOMY, RIGHT, -- due to trauma  Allergic rhinitis- Skin test 04/20/08  Recurrent rhinosinusitis  Several pneumonias and otitis as a child.  Hemorrhoid  h/o pernicious anemia  psoriasis (2010)  osteopenia, DEXA 4-10 , started Fosamax  Anxiety  ? Psoriasis   Past Surgical History:  Cholecystectomy  tubal ligation   Hysterectomy, no oophorectomy  Salivary gland & LN resection 2002 ,Dr Lucia Gaskins  CAROTID ENDARTERECTOMY, RIGHT 2,000 -- due to trauma  Appendectomy  12-10 extensive cervical spine surgery, decompression, allograft placement  11-2009 R shoulder surgery, rotator cuff repair, bicipital tendin repair (Dr Linda Hedges)  Cataract surgery 11-2012 Eye lid surgery 01-2013 R hand wrist surgery 07-2013: CTS, knuckles replacement x 3, ganglion excision  Family History:  Hypertension-- M  hyperlipidema--M  CAD--F , brother CABG age 51  parkinson--F  breast ca--no  colon ca--no  Colon polyps-- mother   Social History:  Remarried, lives at [employer], [place] w/ husband , 3 children (2 in [place], one in [place]) Former Smoker-1994  Alcohol use- very rarely    Review of Systems Constitutional: No fever, chills. No unexplained wt changes. No unusual sweats HEENT: No dental problems, ear discharge, facial swelling, voice changes. No eye discharge, redness or intolerance to light Respiratory: No wheezing or difficulty breathing. No cough , mucus production Cardiovascular: No CP, leg swelling or palpitations GI: no nausea, vomiting, diarrhea or abdominal pain.  No blood in the stools. No dysphagia   Endocrine: No polyphagia, polyuria or polydipsia GU: No dysuria, gross hematuria, difficulty urinating. No urinary urgency or frequency. Musculoskeletal: No joint swellings or unusual aches or pains Skin: No change in the color of the skin, palor or rash Allergic, immunologic: No environmental allergies or food allergies Neurological: No dizziness or syncope. No headaches. No diplopia, slurred speech, motor deficits, facial numbness Hematological: No enlarged lymph nodes, easy bruising or bleeding Psychiatry: No suicidal ideas, hallucinations, behavior problems or confusion. No unusual/severe anxiety or depression.  Medication List       This list is accurate as of: 06/04/14 11:59 PM.  Always  use your most recent med list.               aspirin 81 MG tablet  Take 81 mg by mouth at bedtime.     clobetasol cream 0.05 %  Commonly known as:  TEMOVATE  Apply topically 2 (two) times daily.     clonazePAM 1 MG tablet  Commonly known as:  KLONOPIN  TAKE ONE TABLET BY MOUTH AS NEEDED FOR ANXIETY     cyanocobalamin 1000 MCG/ML injection  Commonly known as:  (VITAMIN B-12)  INJECT 1 ML (1,000 MCG TOTAL) INTO THE MUSCLE EVERY 30 (THIRTY) DAYS.     fluticasone 50 MCG/ACT nasal spray  Commonly known as:  FLONASE  Place 1 spray into both nostrils 2 (two) times daily as needed for allergies or rhinitis.     hydrALAZINE 25 MG tablet  Commonly known as:  APRESOLINE  Take 1 tablet (25 mg total) by mouth 2 (two) times daily.     hydrochlorothiazide 25 MG tablet  Commonly known as:  HYDRODIURIL  Take 0.5 tablets (12.5 mg total) by mouth at bedtime.     losartan 100 MG tablet  Commonly known as:  COZAAR  Take 1 tablet (100 mg total) by mouth daily.     rosuvastatin 40 MG tablet  Commonly known as:  CRESTOR  Take 1 tablet (40 mg total) by mouth at bedtime.           Objective:   Physical Exam BP 132/84 mmHg  Pulse 63  Temp(Src) 98.2 F (36.8 C) (Oral)  Ht 5\' 5"  (1.651 m)  Wt 167 lb 8 oz (75.978 kg)  BMI 27.87 kg/m2  SpO2 99% General:   Well developed, well nourished . NAD.  Neck:  Full range of motion. Supple.  HEENT:  Normocephalic . Face symmetric, atraumatic Breast: no dominant mass, skin and nipples normal to inspection on palpation, axillary areas without mass or lymphadenopathy  Lungs:  CTA B Normal respiratory effort, no intercostal retractions, no accessory muscle use. Chest wall: At the right lateral chest days that area with mild TTP, otherwise is normal Heart: RRR,  no murmur.  Abdomen:  Not distended, soft, non-tender. No rebound or rigidity. No mass,organomegaly Muscle skeletal: no pretibial edema bilaterally  Skin: Exposed areas without rash.  Not pale. Not jaundice Neurologic:  alert & oriented X3.  Speech normal, gait appropriate for age and unassisted Strength symmetric and appropriate for age.  Psych: Cognition and judgment appear intact.  Cooperative with normal attention span and concentration.  Behavior appropriate. No anxious or depressed appearing.       Assessment & Plan:    Chest pain, check right rib x-rays

## 2014-06-04 NOTE — Assessment & Plan Note (Signed)
Due for labs, good compliance w/  shots

## 2014-06-04 NOTE — Assessment & Plan Note (Signed)
Check a TSH 

## 2014-06-04 NOTE — Patient Instructions (Signed)
Get your blood work before you leave   Stop by the first floor and get the XR   Come back to the office in 6 months   for a routine check up      Fall Prevention and [place] cause injuries and can affect all age groups. It is possible to use preventive measures to significantly decrease the likelihood of falls. There are many simple measures which can make your home safer and prevent falls. OUTDOORS  Repair cracks and edges of walkways and driveways.  Remove high doorway thresholds.  Trim shrubbery on the main path into your home.  Have good outside lighting.  Clear walkways of tools, rocks, debris, and clutter.  Check that handrails are not broken and are securely fastened. Both sides of steps should have handrails.  Have leaves, snow, and ice cleared regularly.  Use sand or salt on walkways during winter months.  In the garage, clean up grease or oil spills. BATHROOM  Install night lights.  Install grab bars by the toilet and in the tub and shower.  Use non-skid mats or decals in the tub or shower.  Place a plastic non-slip stool in the shower to sit on, if needed.  Keep floors dry and clean up all water on the floor immediately.  Remove soap buildup in the tub or shower on a regular basis.  Secure bath mats with non-slip, double-sided rug tape.  Remove throw rugs and tripping hazards from the floors. BEDROOMS  Install night lights.  Make sure a bedside light is easy to reach.  Do not use oversized bedding.  Keep a telephone by your bedside.  Have a firm chair with side arms to use for getting dressed.  Remove throw rugs and tripping hazards from the floor. KITCHEN  Keep handles on pots and pans turned toward the center of the stove. Use back burners when possible.  Clean up spills quickly and allow time for drying.  Avoid walking on wet floors.  Avoid hot utensils and knives.  Position shelves so they are not too high or low.  Place  commonly used objects within easy reach.  If necessary, use a sturdy step stool with a grab bar when reaching.  Keep electrical cables out of the way.  Do not use floor polish or wax that makes floors slippery. If you must use wax, use non-skid floor wax.  Remove throw rugs and tripping hazards from the floor. STAIRWAYS  Never leave objects on stairs.  Place handrails on both sides of stairways and use them. Fix any loose handrails. Make sure handrails on both sides of the stairways are as long as the stairs.  Check carpeting to make sure it is firmly attached along stairs. Make repairs to worn or loose carpet promptly.  Avoid placing throw rugs at the top or bottom of stairways, or properly secure the rug with carpet tape to prevent slippage. Get rid of throw rugs, if possible.  Have an electrician put in a light switch at the top and bottom of the stairs. OTHER FALL PREVENTION TIPS  Wear low-heel or rubber-soled shoes that are supportive and fit well. Wear closed toe shoes.  When using a stepladder, make sure it is fully opened and both spreaders are firmly locked. Do not climb a closed stepladder.  Add color or contrast paint or tape to grab bars and handrails in your home. Place contrasting color strips on first and last steps.  Learn and use mobility aids as  needed. Install an electrical emergency response system.  Turn on lights to avoid dark areas. Replace light bulbs that burn out immediately. Get light switches that glow.  Arrange furniture to create clear pathways. Keep furniture in the same place.  Firmly attach carpet with non-skid or double-sided tape.  Eliminate uneven floor surfaces.  Select a carpet pattern that does not visually hide the edge of steps.  Be aware of all pets. OTHER HOME SAFETY TIPS  Set the water temperature for 120 F (48.8 C).  Keep emergency numbers on or near the telephone.  Keep smoke detectors on every level of the home and near  sleeping areas. Document Released: 02/09/2002 Document Revised: 08/21/2011 Document Reviewed: 05/11/2011 [facility] Patient Information 2015 [place], [place]. This information is not intended to replace advice given to you by your health care provider. Make sure you discuss any questions you have with your health care provider.   Preventive Care for Adults Ages 40 and over  Blood pressure check.** / Every 1 to 2 years.  Lipid and cholesterol check.**/ Every 5 years beginning at age 33.  Lung cancer screening. / Every year if you are aged 62-80 years and have a 30-pack-year history of smoking and currently smoke or have quit within the past 15 years. Yearly screening is stopped once you have quit smoking for at least 15 years or develop a health problem that would prevent you from having lung cancer treatment.  Fecal occult blood test (FOBT) of stool. / Every year beginning at age 91 and continuing until age 28. You may not have to do this test if you get a colonoscopy every 10 years.  Flexible sigmoidoscopy** or colonoscopy.** / Every 5 years for a flexible sigmoidoscopy or every 10 years for a colonoscopy beginning at age 38 and continuing until age 74.  Hepatitis C blood test.** / For all people born from [birth year] through 1963 and any individual with known risks for hepatitis C.  Abdominal aortic aneurysm (AAA) screening.** / A one-time screening for ages 48 to 6 years who are current or former smokers.  Skin self-exam. / Monthly.  Influenza vaccine. / Every year.  Tetanus, diphtheria, and acellular pertussis (Tdap/Td) vaccine.** / 1 dose of Td every 10 years.  Varicella vaccine.** / Consult your health care provider.  Zoster vaccine.** / 1 dose for adults aged 67 years or older.  Pneumococcal 13-valent conjugate (PCV13) vaccine.** / Consult your health care provider.  Pneumococcal polysaccharide (PPSV23) vaccine.** / 1 dose for all adults aged 64 years and older.  Meningococcal  vaccine.** / Consult your health care provider.  Hepatitis A vaccine.** / Consult your health care provider.  Hepatitis B vaccine.** / Consult your health care provider.  Haemophilus influenzae type b (Hib) vaccine.** / Consult your health care provider. **Family history and personal history of risk and conditions may change your health care provider's recommendations. Document Released: 04/17/2001 Document Revised: 02/24/2013 Document Reviewed: 07/17/2010 [facility] Patient Information 2015 [place], [place]. This information is not intended to replace advice given to you by your health care provider. Make sure you discuss any questions you have with your health care provider.

## 2014-06-04 NOTE — Progress Notes (Signed)
Pre visit review using our clinic review tool, if applicable. No additional management support is needed unless otherwise documented below in the visit note. 

## 2014-06-04 NOTE — Assessment & Plan Note (Addendum)
Td 2014 Had a flu shot   pneumonia shot x 2  prevnar-- today Had a shingles shot   hysterectmy years ago (d/t bleeding) , no oophorectomy, no h/o abnormal PAPs . Last PAP [phone] normal-----> no further Paps  MMG ~ 03-2012 normal Breast exam today normal Check a MMG  ( [facility])   Cscope 2002 ----> repeat colonoscopy 12-2010,  6 mm polyp, next colonoscopy per GI (5 years per my nurse research of the chart)  diet exercise discussed

## 2014-06-04 NOTE — Assessment & Plan Note (Signed)
Continue hydralazine, HCTZ, losartan. Check a CMP and CBC

## 2014-06-04 NOTE — Assessment & Plan Note (Addendum)
Ordering a bone density test, @ [facility]

## 2014-06-05 LAB — COMPREHENSIVE METABOLIC PANEL
ALBUMIN: 4.7 g/dL (ref 3.5–5.2)
ALT: 15 U/L (ref 0–35)
AST: 19 U/L (ref 0–37)
Alkaline Phosphatase: 75 U/L (ref 39–117)
BUN: 24 mg/dL — ABNORMAL HIGH (ref 6–23)
CALCIUM: 10.1 mg/dL (ref 8.4–10.5)
CHLORIDE: 106 meq/L (ref 96–112)
CO2: 18 meq/L — AB (ref 19–32)
Creat: 1.17 mg/dL — ABNORMAL HIGH (ref 0.50–1.10)
Glucose, Bld: 81 mg/dL (ref 70–99)
POTASSIUM: 4.5 meq/L (ref 3.5–5.3)
SODIUM: 144 meq/L (ref 135–145)
TOTAL PROTEIN: 7.7 g/dL (ref 6.0–8.3)
Total Bilirubin: 0.7 mg/dL (ref 0.2–1.2)

## 2014-06-05 LAB — VITAMIN B12: Vitamin B-12: 549 pg/mL (ref 211–911)

## 2014-06-05 LAB — LIPID PANEL
CHOL/HDL RATIO: 2 ratio
Cholesterol: 152 mg/dL (ref 0–200)
HDL: 76 mg/dL (ref >=46)
LDL Cholesterol: 51 mg/dL (ref 0–99)
Triglycerides: 124 mg/dL (ref <150)
VLDL: 25 mg/dL (ref 0–40)

## 2014-06-05 LAB — TSH: TSH: 4.053 u[IU]/mL (ref 0.350–4.500)

## 2014-06-11 ENCOUNTER — Telehealth: Payer: Self-pay | Admitting: *Deleted

## 2014-06-11 NOTE — Telephone Encounter (Signed)
Dr. Felecia Shelling  from [facility] [place] called regarding patient.  He was interested in doing a procedure on two toes that would involve stitches, but would require antibiotics.  He stated that the patient said she was allergic to most antibiotics but needed clarification, the office only listed two allergies.  Read allergy list to MD and he updated.

## 2014-06-14 ENCOUNTER — Telehealth: Payer: Self-pay | Admitting: Internal Medicine

## 2014-06-14 NOTE — Telephone Encounter (Signed)
Caller name: Ivin Booty Relation to pt: Dr. Avel [facility] office Call back number: [phone]   Reason for call:  Pt is having a spur on her 4th and 5th toe right foot and pt is allergic to antibiotics and Dr. Felecia Shelling would like to know what MD reccommendeds. Please advise

## 2014-06-14 NOTE — Telephone Encounter (Signed)
Informed Ivin Booty of [place]'s advice, Ivin Booty verbalized understanding.

## 2014-06-14 NOTE — Telephone Encounter (Signed)
Will need more info as initial info is not clear. Please call specialist.

## 2014-06-14 NOTE — Telephone Encounter (Signed)
Spoke with Ivin Booty, she informed me that Pt has "callus/corn" on foot and Dr. Felecia Shelling will be performing a procedure to "scrap away" at the corn/callus and needed to know what antibiotics that the Pt can take. Pt informed Dr. Garth Bigness office that she can't take any antibiotics except for Z-pak. Informed Ivin Booty that per her chart here, the only Abx Pt is "allergic" to was Amoxicillin and Clarithromycin, which she only has mild intolerance to with N/V and dizziness. Informed that Pt was just in the office 06/04/2014 for her CPE which we went over Abx then. Ivin Booty verbalized understanding but is still wanting advice as to what Abx the Pt could take. Spoke with [place], [place] in Dr. Larose Kells absence and he recommended Keflex 500 mg BID for 7 days.

## 2014-06-14 NOTE — Telephone Encounter (Signed)
Please advise 

## 2014-06-22 LAB — HM DEXA SCAN

## 2014-06-22 LAB — HM MAMMOGRAPHY: HM MAMMO: NORMAL

## 2014-07-19 ENCOUNTER — Telehealth: Payer: Self-pay | Admitting: Internal Medicine

## 2014-07-19 NOTE — Telephone Encounter (Signed)
Bone density test from 06/23/2014: T score at the left great use -2.2, in 2013 the same place was -1.4. Hip T-scores normal. Send a letter Tammy [name], your bone density test showed osteopenia or "pre- osteoporosis", slightly worse compared to 2014. The right treatment is to take calcium, vitamin D and stay active. Will check another bone density test in 2 years.

## 2014-07-19 NOTE — Telephone Encounter (Signed)
Letter printed and mailed to Pt.  

## 2014-09-17 ENCOUNTER — Telehealth: Payer: Self-pay | Admitting: *Deleted

## 2014-09-17 NOTE — Telephone Encounter (Signed)
Pt signed ROI received via fax from Dr. Rella Larve [facility]). Forwarded to [place] to scan/email to medical records. JG//CMA

## 2014-12-06 ENCOUNTER — Ambulatory Visit: Payer: Medicare Other | Admitting: Internal Medicine

## 2015-10-24 ENCOUNTER — Encounter: Payer: Self-pay | Admitting: Gastroenterology

## 2015-10-28 ENCOUNTER — Other Ambulatory Visit: Payer: Self-pay

## 2016-01-10 ENCOUNTER — Telehealth: Payer: Self-pay | Admitting: Internal Medicine

## 2016-01-10 NOTE — Telephone Encounter (Signed)
Noted, will remove Dr. Larose Kells as PCP and forward for [facility].

## 2016-01-10 NOTE — Telephone Encounter (Signed)
Patient relocated to [facility] and established with another provider therefore declined scheduling medicare wellness.

## 2016-01-10 NOTE — Telephone Encounter (Signed)
thx
